# Patient Record
Sex: Female | Born: 1955
Health system: Southern US, Community
[De-identification: ages and names within clinical notes are randomized; demographics above are authoritative.]

## PROBLEM LIST (undated history)

## (undated) DIAGNOSIS — I10 Essential (primary) hypertension: Secondary | ICD-10-CM

## (undated) DIAGNOSIS — E559 Vitamin D deficiency, unspecified: Principal | ICD-10-CM

## (undated) DIAGNOSIS — J209 Acute bronchitis, unspecified: Secondary | ICD-10-CM

## (undated) DIAGNOSIS — M8580 Other specified disorders of bone density and structure, unspecified site: Secondary | ICD-10-CM

## (undated) DIAGNOSIS — K219 Gastro-esophageal reflux disease without esophagitis: Secondary | ICD-10-CM

## (undated) DIAGNOSIS — F419 Anxiety disorder, unspecified: Secondary | ICD-10-CM

## (undated) DIAGNOSIS — E785 Hyperlipidemia, unspecified: Principal | ICD-10-CM

## (undated) HISTORY — DX: Gastro-esophageal reflux disease without esophagitis: K21.9

## (undated) HISTORY — DX: Acute bronchitis, unspecified: J20.9

## (undated) HISTORY — DX: Anxiety disorder, unspecified: F41.9

## (undated) HISTORY — DX: Hyperlipidemia, unspecified: E78.5

## (undated) HISTORY — DX: Vitamin D deficiency, unspecified: E55.9

## (undated) HISTORY — DX: Essential (primary) hypertension: I10

## (undated) HISTORY — DX: Other specified disorders of bone density and structure, unspecified site: M85.80

---

## 1983-07-04 HISTORY — PX: ECTOPIC PREGNANCY SURGERY: SHX613

## 2014-07-03 LAB — HM PAP SMEAR: HM Pap smear: NORMAL

## 2014-07-03 LAB — HM MAMMOGRAPHY: HM Mammogram: NORMAL

## 2015-01-05 ENCOUNTER — Encounter: Payer: Self-pay | Admitting: *Deleted

## 2015-01-05 ENCOUNTER — Telehealth: Payer: Self-pay | Admitting: *Deleted

## 2015-01-05 NOTE — Telephone Encounter (Signed)
Pre-Visit Call completed with patient and chart updated.   Pre-Visit Info documented in Specialty Comments under [employer].    

## 2015-01-06 ENCOUNTER — Ambulatory Visit (INDEPENDENT_AMBULATORY_CARE_PROVIDER_SITE_OTHER): Payer: BLUE CROSS/BLUE SHIELD | Admitting: Family

## 2015-01-06 ENCOUNTER — Encounter: Payer: Self-pay | Admitting: Family

## 2015-01-06 VITALS — BP 130/90 | HR 85 | Temp 98.4°F | Resp 16 | Ht 62.25 in | Wt 143.6 lb

## 2015-01-06 DIAGNOSIS — Z23 Encounter for immunization: Secondary | ICD-10-CM | POA: Diagnosis not present

## 2015-01-06 DIAGNOSIS — M8580 Other specified disorders of bone density and structure, unspecified site: Secondary | ICD-10-CM

## 2015-01-06 DIAGNOSIS — Z0000 Encounter for general adult medical examination without abnormal findings: Secondary | ICD-10-CM | POA: Diagnosis not present

## 2015-01-06 DIAGNOSIS — I10 Essential (primary) hypertension: Secondary | ICD-10-CM | POA: Insufficient documentation

## 2015-01-06 LAB — BASIC METABOLIC PANEL
BUN: 15 mg/dL (ref 6–23)
CALCIUM: 10 mg/dL (ref 8.4–10.5)
CHLORIDE: 102 meq/L (ref 96–112)
CO2: 28 meq/L (ref 19–32)
Creatinine, Ser: 0.71 mg/dL (ref 0.40–1.20)
GFR: 89.4 mL/min (ref >60.00)
Glucose, Bld: 81 mg/dL (ref 70–99)
POTASSIUM: 3.3 meq/L — AB (ref 3.5–5.1)
SODIUM: 140 meq/L (ref 135–145)

## 2015-01-06 LAB — URINALYSIS, ROUTINE W REFLEX MICROSCOPIC
Bilirubin Urine: NEGATIVE
HGB URINE DIPSTICK: NEGATIVE
Ketones, ur: NEGATIVE
Leukocytes, UA: NEGATIVE
Nitrite: NEGATIVE
RBC / HPF: NONE SEEN (ref 0–?)
Specific Gravity, Urine: 1.01 (ref 1.000–1.030)
Total Protein, Urine: NEGATIVE
URINE GLUCOSE: NEGATIVE
Urobilinogen, UA: 0.2 (ref 0.0–1.0)
WBC, UA: NONE SEEN (ref 0–?)
pH: 7.5 (ref 5.0–8.0)

## 2015-01-06 LAB — HEPATIC FUNCTION PANEL
ALT: 15 U/L (ref 0–35)
AST: 14 U/L (ref 0–37)
Albumin: 4.1 g/dL (ref 3.5–5.2)
Alkaline Phosphatase: 50 U/L (ref 39–117)
BILIRUBIN DIRECT: 0 mg/dL (ref 0.0–0.3)
Total Bilirubin: 0.4 mg/dL (ref 0.2–1.2)
Total Protein: 7.3 g/dL (ref 6.0–8.3)

## 2015-01-06 LAB — LIPID PANEL
Cholesterol: 251 mg/dL — ABNORMAL HIGH (ref 0–200)
HDL: 88 mg/dL (ref >39.00)
LDL CALC: 139 mg/dL — AB (ref 0–99)
NonHDL: 163
TRIGLYCERIDES: 121 mg/dL (ref 0.0–149.0)
Total CHOL/HDL Ratio: 3
VLDL: 24.2 mg/dL (ref 0.0–40.0)

## 2015-01-06 LAB — TSH: TSH: 1.81 u[IU]/mL (ref 0.35–4.50)

## 2015-01-06 NOTE — Assessment & Plan Note (Signed)
Discussed healthy diet, exercise. Obtain routine lab work. Refer for mammogram, colonoscopy, dexa.

## 2015-01-06 NOTE — Patient Instructions (Addendum)
Please complete lab work prior to leaving. Welcome to [facility]!  Preventive Care for Adults A healthy lifestyle and preventive care can promote health and wellness. Preventive health guidelines for women include the following key practices.  A routine yearly physical is a good way to check with your health care provider about your health and preventive screening. It is a chance to share any concerns and updates on your health and to receive a thorough exam.  Visit your dentist for a routine exam and preventive care every 6 months. Brush your teeth twice a day and floss once a day. Good oral hygiene prevents tooth decay and gum disease.  The frequency of eye exams is based on your age, health, family medical history, use of contact lenses, and other factors. Follow your health care provider's recommendations for frequency of eye exams.  Eat a healthy diet. Foods like vegetables, fruits, whole grains, low-fat dairy products, and lean protein foods contain the nutrients you need without too many calories. Decrease your intake of foods high in solid fats, added sugars, and salt. Eat the right amount of calories for you.Get information about a proper diet from your health care provider, if necessary.  Regular physical exercise is one of the most important things you can do for your health. Most adults should get at least 150 minutes of moderate-intensity exercise (any activity that increases your heart rate and causes you to sweat) each week. In addition, most adults need muscle-strengthening exercises on 2 or more days a week.  Maintain a healthy weight. The body mass index (BMI) is a screening tool to identify possible weight problems. It provides an estimate of body fat based on height and weight. Your health care provider can find your BMI and can help you achieve or maintain a healthy weight.For adults 20 years and older:  A BMI below 18.5 is considered underweight.  A BMI of 18.5 to 24.9 is  normal.  A BMI of 25 to 29.9 is considered overweight.  A BMI of 30 and above is considered obese.  Maintain normal blood lipids and cholesterol levels by exercising and minimizing your intake of saturated fat. Eat a balanced diet with plenty of fruit and vegetables. Blood tests for lipids and cholesterol should begin at age 53 and be repeated every 5 years. If your lipid or cholesterol levels are high, you are over 50, or you are at high risk for heart disease, you may need your cholesterol levels checked more frequently.Ongoing high lipid and cholesterol levels should be treated with medicines if diet and exercise are not working.  If you smoke, find out from your health care provider how to quit. If you do not use tobacco, do not start.  Lung cancer screening is recommended for adults aged 26-80 years who are at high risk for developing lung cancer because of a history of smoking. A yearly low-dose CT scan of the lungs is recommended for people who have at least a 30-pack-year history of smoking and are a current smoker or have quit within the past 15 years. A pack year of smoking is smoking an average of 1 pack of cigarettes a day for 1 year (for example: 1 pack a day for 30 years or 2 packs a day for 15 years). Yearly screening should continue until the smoker has stopped smoking for at least 15 years. Yearly screening should be stopped for people who develop a health problem that would prevent them from having lung cancer treatment.  If  you are pregnant, do not drink alcohol. If you are breastfeeding, be very cautious about drinking alcohol. If you are not pregnant and choose to drink alcohol, do not have more than 1 drink per day. One drink is considered to be 12 ounces (355 mL) of beer, 5 ounces (148 mL) of wine, or 1.5 ounces (44 mL) of liquor.  Avoid use of street drugs. Do not share needles with anyone. Ask for help if you need support or instructions about stopping the use of  drugs.  High blood pressure causes heart disease and increases the risk of stroke. Your blood pressure should be checked at least every 1 to 2 years. Ongoing high blood pressure should be treated with medicines if weight loss and exercise do not work.  If you are 22-13 years old, ask your health care provider if you should take aspirin to prevent strokes.  Diabetes screening involves taking a blood sample to check your fasting blood sugar level. This should be done once every 3 years, after age 45, if you are within normal weight and without risk factors for diabetes. Testing should be considered at a younger age or be carried out more frequently if you are overweight and have at least 1 risk factor for diabetes.  Breast cancer screening is essential preventive care for women. You should practice "breast self-awareness." This means understanding the normal appearance and feel of your breasts and may include breast self-examination. Any changes detected, no matter how small, should be reported to a health care provider. Women in their 64s and 30s should have a clinical breast exam (CBE) by a health care provider as part of a regular health exam every 1 to 3 years. After age 32, women should have a CBE every year. Starting at age 58, women should consider having a mammogram (breast X-ray test) every year. Women who have a family history of breast cancer should talk to their health care provider about genetic screening. Women at a high risk of breast cancer should talk to their health care providers about having an MRI and a mammogram every year.  Breast cancer gene (BRCA)-related cancer risk assessment is recommended for women who have family members with BRCA-related cancers. BRCA-related cancers include breast, ovarian, tubal, and peritoneal cancers. Having family members with these cancers may be associated with an increased risk for harmful changes (mutations) in the breast cancer genes BRCA1 and BRCA2.  Results of the assessment will determine the need for genetic counseling and BRCA1 and BRCA2 testing.  Routine pelvic exams to screen for cancer are no longer recommended for nonpregnant women who are considered low risk for cancer of the pelvic organs (ovaries, uterus, and vagina) and who do not have symptoms. Ask your health care provider if a screening pelvic exam is right for you.  If you have had past treatment for cervical cancer or a condition that could lead to cancer, you need Pap tests and screening for cancer for at least 20 years after your treatment. If Pap tests have been discontinued, your risk factors (such as having a new sexual partner) need to be reassessed to determine if screening should be resumed. Some women have medical problems that increase the chance of getting cervical cancer. In these cases, your health care provider may recommend more frequent screening and Pap tests.  The HPV test is an additional test that may be used for cervical cancer screening. The HPV test looks for the virus that can cause the cell changes on  the cervix. The cells collected during the Pap test can be tested for HPV. The HPV test could be used to screen women aged 60 years and older, and should be used in women of any age who have unclear Pap test results. After the age of 51, women should have HPV testing at the same frequency as a Pap test.  Colorectal cancer can be detected and often prevented. Most routine colorectal cancer screening begins at the age of 4 years and continues through age 63 years. However, your health care provider may recommend screening at an earlier age if you have risk factors for colon cancer. On a yearly basis, your health care provider may provide home test kits to check for hidden blood in the stool. Use of a small camera at the end of a tube, to directly examine the colon (sigmoidoscopy or colonoscopy), can detect the earliest forms of colorectal cancer. Talk to your health  care provider about this at age 95, when routine screening begins. Direct exam of the colon should be repeated every 5-10 years through age 64 years, unless early forms of pre-cancerous polyps or small growths are found.  People who are at an increased risk for hepatitis B should be screened for this virus. You are considered at high risk for hepatitis B if:  You were born in a country where hepatitis B occurs often. Talk with your health care provider about which countries are considered high risk.  Your parents were born in a high-risk country and you have not received a shot to protect against hepatitis B (hepatitis B vaccine).  You have HIV or AIDS.  You use needles to inject street drugs.  You live with, or have sex with, someone who has hepatitis B.  You get hemodialysis treatment.  You take certain medicines for conditions like cancer, organ transplantation, and autoimmune conditions.  Hepatitis C blood testing is recommended for all people born from [birth year] through 1965 and any individual with known risks for hepatitis C.  Practice safe sex. Use condoms and avoid high-risk sexual practices to reduce the spread of sexually transmitted infections (STIs). STIs include gonorrhea, chlamydia, syphilis, trichomonas, herpes, HPV, and human immunodeficiency virus (HIV). Herpes, HIV, and HPV are viral illnesses that have no cure. They can result in disability, cancer, and death.  You should be screened for sexually transmitted illnesses (STIs) including gonorrhea and chlamydia if:  You are sexually active and are younger than 24 years.  You are older than 24 years and your health care provider tells you that you are at risk for this type of infection.  Your sexual activity has changed since you were last screened and you are at an increased risk for chlamydia or gonorrhea. Ask your health care provider if you are at risk.  If you are at risk of being infected with HIV, it is recommended  that you take a prescription medicine daily to prevent HIV infection. This is called preexposure prophylaxis (PrEP). You are considered at risk if:  You are a heterosexual woman, are sexually active, and are at increased risk for HIV infection.  You take drugs by injection.  You are sexually active with a partner who has HIV.  Talk with your health care provider about whether you are at high risk of being infected with HIV. If you choose to begin PrEP, you should first be tested for HIV. You should then be tested every 3 months for as long as you are taking PrEP.  Osteoporosis  is a disease in which the bones lose minerals and strength with aging. This can result in serious bone fractures or breaks. The risk of osteoporosis can be identified using a bone density scan. Women ages 33 years and over and women at risk for fractures or osteoporosis should discuss screening with their health care providers. Ask your health care provider whether you should take a calcium supplement or vitamin D to reduce the rate of osteoporosis.  Menopause can be associated with physical symptoms and risks. Hormone replacement therapy is available to decrease symptoms and risks. You should talk to your health care provider about whether hormone replacement therapy is right for you.  Use sunscreen. Apply sunscreen liberally and repeatedly throughout the day. You should seek shade when your shadow is shorter than you. Protect yourself by wearing long sleeves, pants, a wide-brimmed hat, and sunglasses year round, whenever you are outdoors.  Once a month, do a whole body skin exam, using a mirror to look at the skin on your back. Tell your health care provider of new moles, moles that have irregular borders, moles that are larger than a pencil eraser, or moles that have changed in shape or color.  Stay current with required vaccines (immunizations).  Influenza vaccine. All adults should be immunized every year.  Tetanus,  diphtheria, and acellular pertussis (Td, Tdap) vaccine. Pregnant women should receive 1 dose of Tdap vaccine during each pregnancy. The dose should be obtained regardless of the length of time since the last dose. Immunization is preferred during the 27th-36th week of gestation. An adult who has not previously received Tdap or who does not know her vaccine status should receive 1 dose of Tdap. This initial dose should be followed by tetanus and diphtheria toxoids (Td) booster doses every 10 years. Adults with an unknown or incomplete history of completing a 3-dose immunization series with Td-containing vaccines should begin or complete a primary immunization series including a Tdap dose. Adults should receive a Td booster every 10 years.  Varicella vaccine. An adult without evidence of immunity to varicella should receive 2 doses or a second dose if she has previously received 1 dose. Pregnant females who do not have evidence of immunity should receive the first dose after pregnancy. This first dose should be obtained before leaving the health care facility. The second dose should be obtained 4-8 weeks after the first dose.  Human papillomavirus (HPV) vaccine. Females aged 13-26 years who have not received the vaccine previously should obtain the 3-dose series. The vaccine is not recommended for use in pregnant females. However, pregnancy testing is not needed before receiving a dose. If a female is found to be pregnant after receiving a dose, no treatment is needed. In that case, the remaining doses should be delayed until after the pregnancy. Immunization is recommended for any person with an immunocompromised condition through the age of 77 years if she did not get any or all doses earlier. During the 3-dose series, the second dose should be obtained 4-8 weeks after the first dose. The third dose should be obtained 24 weeks after the first dose and 16 weeks after the second dose.  Zoster vaccine. One dose  is recommended for adults aged 26 years or older unless certain conditions are present.  Measles, mumps, and rubella (MMR) vaccine. Adults born before [birth year] generally are considered immune to measles and mumps. Adults born in [birth year] or later should have 1 or more doses of MMR vaccine unless there is a contraindication  to the vaccine or there is laboratory evidence of immunity to each of the three diseases. A routine second dose of MMR vaccine should be obtained at least 28 days after the first dose for students attending postsecondary schools, health care workers, or international travelers. People who received inactivated measles vaccine or an unknown type of measles vaccine during 1963-1967 should receive 2 doses of MMR vaccine. People who received inactivated mumps vaccine or an unknown type of mumps vaccine before 1979 and are at high risk for mumps infection should consider immunization with 2 doses of MMR vaccine. For females of childbearing age, rubella immunity should be determined. If there is no evidence of immunity, females who are not pregnant should be vaccinated. If there is no evidence of immunity, females who are pregnant should delay immunization until after pregnancy. Unvaccinated health care workers born before [birth year] who lack laboratory evidence of measles, mumps, or rubella immunity or laboratory confirmation of disease should consider measles and mumps immunization with 2 doses of MMR vaccine or rubella immunization with 1 dose of MMR vaccine.  Pneumococcal 13-valent conjugate (PCV13) vaccine. When indicated, a person who is uncertain of her immunization history and has no record of immunization should receive the PCV13 vaccine. An adult aged 73 years or older who has certain medical conditions and has not been previously immunized should receive 1 dose of PCV13 vaccine. This PCV13 should be followed with a dose of pneumococcal polysaccharide (PPSV23) vaccine. The PPSV23 vaccine dose should be  obtained at least 8 weeks after the dose of PCV13 vaccine. An adult aged 79 years or older who has certain medical conditions and previously received 1 or more doses of PPSV23 vaccine should receive 1 dose of PCV13. The PCV13 vaccine dose should be obtained 1 or more years after the last PPSV23 vaccine dose.  Pneumococcal polysaccharide (PPSV23) vaccine. When PCV13 is also indicated, PCV13 should be obtained first. All adults aged 50 years and older should be immunized. An adult younger than age 58 years who has certain medical conditions should be immunized. Any person who resides in a nursing home or long-term care facility should be immunized. An adult smoker should be immunized. People with an immunocompromised condition and certain other conditions should receive both PCV13 and PPSV23 vaccines. People with human immunodeficiency virus (HIV) infection should be immunized as soon as possible after diagnosis. Immunization during chemotherapy or radiation therapy should be avoided. Routine use of PPSV23 vaccine is not recommended for American Indians, [place] Natives, or people younger than 65 years unless there are medical conditions that require PPSV23 vaccine. When indicated, people who have unknown immunization and have no record of immunization should receive PPSV23 vaccine. One-time revaccination 5 years after the first dose of PPSV23 is recommended for people aged 19-64 years who have chronic kidney failure, nephrotic syndrome, asplenia, or immunocompromised conditions. People who received 1-2 doses of PPSV23 before age 73 years should receive another dose of PPSV23 vaccine at age 9 years or later if at least 5 years have passed since the previous dose. Doses of PPSV23 are not needed for people immunized with PPSV23 at or after age 48 years.  Meningococcal vaccine. Adults with asplenia or persistent complement component deficiencies should receive 2 doses of quadrivalent meningococcal conjugate  (MenACWY-D) vaccine. The doses should be obtained at least 2 months apart. Microbiologists working with certain meningococcal bacteria, [employer] recruits, people at risk during an outbreak, and people who travel to or live in countries with a high rate of  meningitis should be immunized. A first-year college student up through age 21 years who is living in a residence hall should receive a dose if she did not receive a dose on or after her 16th birthday. Adults who have certain high-risk conditions should receive one or more doses of vaccine.  Hepatitis A vaccine. Adults who wish to be protected from this disease, have certain high-risk conditions, work with hepatitis A-infected animals, work in hepatitis A research labs, or travel to or work in countries with a high rate of hepatitis A should be immunized. Adults who were previously unvaccinated and who anticipate close contact with an international adoptee during the first 60 days after arrival in the [place] from a country with a high rate of hepatitis A should be immunized.  Hepatitis B vaccine. Adults who wish to be protected from this disease, have certain high-risk conditions, may be exposed to blood or other infectious body fluids, are household contacts or sex partners of hepatitis B positive people, are clients or workers in certain care facilities, or travel to or work in countries with a high rate of hepatitis B should be immunized.  Haemophilus influenzae type b (Hib) vaccine. A previously unvaccinated person with asplenia or sickle cell disease or having a scheduled splenectomy should receive 1 dose of Hib vaccine. Regardless of previous immunization, a recipient of a hematopoietic stem cell transplant should receive a 3-dose series 6-12 months after her successful transplant. Hib vaccine is not recommended for adults with HIV infection. Preventive Services / Frequency Ages 21 to 57 years  Blood pressure check.** / Every 1 to 2  years.  Lipid and cholesterol check.** / Every 5 years beginning at age 15.  Clinical breast exam.** / Every 3 years for women in their 59s and 7s.  BRCA-related cancer risk assessment.** / For women who have family members with a BRCA-related cancer (breast, ovarian, tubal, or peritoneal cancers).  Pap test.** / Every 2 years from ages 35 through 47. Every 3 years starting at age 58 through age 51 or 67 with a history of 3 consecutive normal Pap tests.  HPV screening.** / Every 3 years from ages 58 through ages 1 to 59 with a history of 3 consecutive normal Pap tests.  Hepatitis C blood test.** / For any individual with known risks for hepatitis C.  Skin self-exam. / Monthly.  Influenza vaccine. / Every year.  Tetanus, diphtheria, and acellular pertussis (Tdap, Td) vaccine.** / Consult your health care provider. Pregnant women should receive 1 dose of Tdap vaccine during each pregnancy. 1 dose of Td every 10 years.  Varicella vaccine.** / Consult your health care provider. Pregnant females who do not have evidence of immunity should receive the first dose after pregnancy.  HPV vaccine. / 3 doses over 6 months, if 58 and younger. The vaccine is not recommended for use in pregnant females. However, pregnancy testing is not needed before receiving a dose.  Measles, mumps, rubella (MMR) vaccine.** / You need at least 1 dose of MMR if you were born in 1957 or later. You may also need a 2nd dose. For females of childbearing age, rubella immunity should be determined. If there is no evidence of immunity, females who are not pregnant should be vaccinated. If there is no evidence of immunity, females who are pregnant should delay immunization until after pregnancy.  Pneumococcal 13-valent conjugate (PCV13) vaccine.** / Consult your health care provider.  Pneumococcal polysaccharide (PPSV23) vaccine.** / 1 to 2 doses if you  smoke cigarettes or if you have certain conditions.  Meningococcal  vaccine.** / 1 dose if you are age 23 to 11 years and a Market researcher living in a residence hall, or have one of several medical conditions, you need to get vaccinated against meningococcal disease. You may also need additional booster doses.  Hepatitis A vaccine.** / Consult your health care provider.  Hepatitis B vaccine.** / Consult your health care provider.  Haemophilus influenzae type b (Hib) vaccine.** / Consult your health care provider. Ages 4 to 75 years  Blood pressure check.** / Every 1 to 2 years.  Lipid and cholesterol check.** / Every 5 years beginning at age 57 years.  Lung cancer screening. / Every year if you are aged 64-80 years and have a 30-pack-year history of smoking and currently smoke or have quit within the past 15 years. Yearly screening is stopped once you have quit smoking for at least 15 years or develop a health problem that would prevent you from having lung cancer treatment.  Clinical breast exam.** / Every year after age 71 years.  BRCA-related cancer risk assessment.** / For women who have family members with a BRCA-related cancer (breast, ovarian, tubal, or peritoneal cancers).  Mammogram.** / Every year beginning at age 62 years and continuing for as long as you are in good health. Consult with your health care provider.  Pap test.** / Every 3 years starting at age 18 years through age 91 or 88 years with a history of 3 consecutive normal Pap tests.  HPV screening.** / Every 3 years from ages 62 years through ages 10 to 69 years with a history of 3 consecutive normal Pap tests.  Fecal occult blood test (FOBT) of stool. / Every year beginning at age 41 years and continuing until age 41 years. You may not need to do this test if you get a colonoscopy every 10 years.  Flexible sigmoidoscopy or colonoscopy.** / Every 5 years for a flexible sigmoidoscopy or every 10 years for a colonoscopy beginning at age 21 years and continuing until age 102  years.  Hepatitis C blood test.** / For all people born from [birth year] through 1965 and any individual with known risks for hepatitis C.  Skin self-exam. / Monthly.  Influenza vaccine. / Every year.  Tetanus, diphtheria, and acellular pertussis (Tdap/Td) vaccine.** / Consult your health care provider. Pregnant women should receive 1 dose of Tdap vaccine during each pregnancy. 1 dose of Td every 10 years.  Varicella vaccine.** / Consult your health care provider. Pregnant females who do not have evidence of immunity should receive the first dose after pregnancy.  Zoster vaccine.** / 1 dose for adults aged 6 years or older.  Measles, mumps, rubella (MMR) vaccine.** / You need at least 1 dose of MMR if you were born in 1957 or later. You may also need a 2nd dose. For females of childbearing age, rubella immunity should be determined. If there is no evidence of immunity, females who are not pregnant should be vaccinated. If there is no evidence of immunity, females who are pregnant should delay immunization until after pregnancy.  Pneumococcal 13-valent conjugate (PCV13) vaccine.** / Consult your health care provider.  Pneumococcal polysaccharide (PPSV23) vaccine.** / 1 to 2 doses if you smoke cigarettes or if you have certain conditions.  Meningococcal vaccine.** / Consult your health care provider.  Hepatitis A vaccine.** / Consult your health care provider.  Hepatitis B vaccine.** / Consult your health care provider.  Haemophilus influenzae  type b (Hib) vaccine.** / Consult your health care provider. Ages 105 years and over  Blood pressure check.** / Every 1 to 2 years.  Lipid and cholesterol check.** / Every 5 years beginning at age 84 years.  Lung cancer screening. / Every year if you are aged 75-80 years and have a 30-pack-year history of smoking and currently smoke or have quit within the past 15 years. Yearly screening is stopped once you have quit smoking for at least 15 years or  develop a health problem that would prevent you from having lung cancer treatment.  Clinical breast exam.** / Every year after age 45 years.  BRCA-related cancer risk assessment.** / For women who have family members with a BRCA-related cancer (breast, ovarian, tubal, or peritoneal cancers).  Mammogram.** / Every year beginning at age 76 years and continuing for as long as you are in good health. Consult with your health care provider.  Pap test.** / Every 3 years starting at age 34 years through age 56 or 69 years with 3 consecutive normal Pap tests. Testing can be stopped between 65 and 70 years with 3 consecutive normal Pap tests and no abnormal Pap or HPV tests in the past 10 years.  HPV screening.** / Every 3 years from ages 25 years through ages 30 or 6 years with a history of 3 consecutive normal Pap tests. Testing can be stopped between 65 and 70 years with 3 consecutive normal Pap tests and no abnormal Pap or HPV tests in the past 10 years.  Fecal occult blood test (FOBT) of stool. / Every year beginning at age 67 years and continuing until age 41 years. You may not need to do this test if you get a colonoscopy every 10 years.  Flexible sigmoidoscopy or colonoscopy.** / Every 5 years for a flexible sigmoidoscopy or every 10 years for a colonoscopy beginning at age 8 years and continuing until age 44 years.  Hepatitis C blood test.** / For all people born from [birth year] through 1965 and any individual with known risks for hepatitis C.  Osteoporosis screening.** / A one-time screening for women ages 41 years and over and women at risk for fractures or osteoporosis.  Skin self-exam. / Monthly.  Influenza vaccine. / Every year.  Tetanus, diphtheria, and acellular pertussis (Tdap/Td) vaccine.** / 1 dose of Td every 10 years.  Varicella vaccine.** / Consult your health care provider.  Zoster vaccine.** / 1 dose for adults aged 16 years or older.  Pneumococcal 13-valent conjugate  (PCV13) vaccine.** / Consult your health care provider.  Pneumococcal polysaccharide (PPSV23) vaccine.** / 1 dose for all adults aged 29 years and older.  Meningococcal vaccine.** / Consult your health care provider.  Hepatitis A vaccine.** / Consult your health care provider.  Hepatitis B vaccine.** / Consult your health care provider.  Haemophilus influenzae type b (Hib) vaccine.** / Consult your health care provider. ** Family history and personal history of risk and conditions may change your health care provider's recommendations. Document Released: 08/15/2001 Document Revised: 11/03/2013 Document Reviewed: 11/14/2010 [facility] Patient Information 2015 [place], [place]. This information is not intended to replace advice given to you by your health care provider. Make sure you discuss any questions you have with your health care provider.

## 2015-01-06 NOTE — Progress Notes (Signed)
Subjective:    [name], female    DOB: 02-Feb-1956, 59 y.o.   MRN: [id number]  HPI  Jeanette Petty is a 59 yr old female who presents today to establish care. Reports that she has used urgent cares but has not had a physical in "years."   Immunizations: due for tetanus Diet: reports that her diet is healthy Exercise: walks some,  Colonoscopy: due Dexa: 5 yrs ago Pap Smear: 2016 normal per pt Mammogram: up to date Vision:  Up to date Dental: up to date  HTN- Reports that she was diagnosed 3-4 yrs ago and has been treated by Dr. Sharlet Salina on [facility].  She is maintained on dyazide.  She reports + compliance with diazide.  BP Readings from Last 3 Encounters:  01/06/15 130/90     Review of Systems  Constitutional: Negative for unexpected weight change.  HENT: Negative for hearing loss and rhinorrhea.   Eyes: Negative for visual disturbance.  Respiratory: Negative for cough.   Cardiovascular: Negative for leg swelling.  Gastrointestinal: Positive for constipation. Negative for nausea and diarrhea.  Genitourinary: Negative for dysuria and frequency.  Musculoskeletal: Negative for myalgias and arthralgias.  Skin: Positive for rash.       Recent poison ivy  Neurological: Negative for headaches.  Hematological: Negative for adenopathy.  Psychiatric/Behavioral: Negative for dysphoric mood and agitation.   Past Medical History  Diagnosis Date  . Hypertension     History   Social History  . Marital Status: Married    Spouse Name: N/A  . Number of Children: N/A  . Years of Education: N/A   Occupational History  . Not on file.   Social History Main Topics  . Smoking status: Former Research scientist (life sciences)  . Smokeless tobacco: Never Used  . Alcohol Use: 0.6 - 1.2 oz/week    1-2 Cans of beer per week  . Drug Use: No  . Sexual Activity: Not on file   Other Topics Concern  . Not on file   Social History Narrative   Married   Works At [employer]   2 children   [place] (daughter)- has 2 children (lives in [place])   [place] (1991)- lives will   Grew up in [place]       Past Surgical History  Procedure Laterality Date  . Ectopic pregnancy surgery  1985    Family History  Problem Relation Age of Onset  . Hypertension Father   . Cancer Father     prostate  . Hyperlipidemia Father   . Cancer Maternal Grandfather 60    colon    No Known Allergies  Current Outpatient Prescriptions on File Prior to Visit  Medication Sig Dispense Refill  . norethindrone-ethinyl estradiol (JUNEL FE,GILDESS FE,LOESTRIN FE) 1-20 MG-MCG tablet Take 1 tablet by mouth daily.    Marland Kitchen triamterene-hydrochlorothiazide (DYAZIDE) 37.5-25 MG per capsule Take 1 capsule by mouth daily.     No current facility-administered medications on file prior to visit.    BP 130/90 mmHg  Pulse 85  Temp(Src) 98.4 F (36.9 C) (Oral)  Resp 16  Ht 5' 2.25" (1.581 m)  Wt 143 lb 9.6 oz (65.137 kg)  BMI 26.06 kg/m2  SpO2 99%  LMP 01/05/2005        Objective:   Physical Exam   Physical Exam  Constitutional: She is oriented to person, place, and time. She appears well-developed and well-nourished. No distress.  HENT:  Head: Normocephalic and atraumatic.  Right Ear:  Tympanic membrane and ear canal normal.  Left Ear: Tympanic membrane and ear canal normal.  Mouth/Throat: Oropharynx is clear and moist.  Eyes: Pupils are equal, round, and reactive to light. No scleral icterus.  Neck: Normal range of motion. No thyromegaly present.  Cardiovascular: Normal rate and regular rhythm.   No murmur heard. Pulmonary/Chest: Effort normal and breath sounds normal. No respiratory distress. He has no wheezes. She has no rales. She exhibits no tenderness.  Abdominal: Soft. Bowel sounds are normal. He exhibits no distension and no mass. There is no tenderness. There is no rebound and no guarding.  Musculoskeletal: She exhibits no edema.  Lymphadenopathy:    She has no cervical  adenopathy.  Neurological: She is alert and oriented to person, place, and time. She has normal patellar reflexes. She exhibits normal muscle tone. Coordination normal.  Skin: Skin is warm and dry.  Psychiatric: She has a normal mood and affect. Her behavior is normal. Judgment and thought content normal. Breast/pelvic: deferred   Assessment & Plan:        Assessment & Plan:  EKG tracing is personally reviewed.  EKG notes NSR.  No acute changes.

## 2015-01-06 NOTE — Progress Notes (Signed)
Pre visit review using our clinic review tool, if applicable. No additional management support is needed unless otherwise documented below in the visit note. 

## 2015-01-06 NOTE — Assessment & Plan Note (Signed)
Fair BP control, continue dyazide. Discussed low sodium diet.

## 2015-01-07 LAB — CBC WITH DIFFERENTIAL/PLATELET
Basophils Absolute: 0.1 10*3/uL (ref 0.0–0.1)
Basophils Relative: 1 % (ref 0.0–3.0)
EOS ABS: 0.1 10*3/uL (ref 0.0–0.7)
Eosinophils Relative: 1 % (ref 0.0–5.0)
HCT: 45.2 % (ref 36.0–46.0)
Hemoglobin: 15.1 g/dL — ABNORMAL HIGH (ref 12.0–15.0)
Lymphocytes Relative: 21.4 % (ref 12.0–46.0)
Lymphs Abs: 1.8 10*3/uL (ref 0.7–4.0)
MCHC: 33.4 g/dL (ref 30.0–36.0)
MCV: 92.5 fl (ref 78.0–100.0)
Monocytes Absolute: 0.2 10*3/uL (ref 0.1–1.0)
Monocytes Relative: 2.3 % — ABNORMAL LOW (ref 3.0–12.0)
NEUTROS ABS: 6.1 10*3/uL (ref 1.4–7.7)
Neutrophils Relative %: 74.3 % (ref 43.0–77.0)
PLATELETS: 263 10*3/uL (ref 150.0–400.0)
RBC: 4.88 Mil/uL (ref 3.87–5.11)
RDW: 14 % (ref 11.5–15.5)
WBC: 8.2 10*3/uL (ref 4.0–10.5)

## 2015-01-11 ENCOUNTER — Encounter: Payer: Self-pay | Admitting: Family

## 2015-01-11 ENCOUNTER — Telehealth: Payer: Self-pay | Admitting: Family

## 2015-01-11 DIAGNOSIS — E785 Hyperlipidemia, unspecified: Secondary | ICD-10-CM | POA: Insufficient documentation

## 2015-01-11 DIAGNOSIS — E876 Hypokalemia: Secondary | ICD-10-CM

## 2015-01-11 HISTORY — DX: Hyperlipidemia, unspecified: E78.5

## 2015-01-11 MED ORDER — POTASSIUM CHLORIDE CRYS ER 10 MEQ PO TBCR: 10.0000 meq | EXTENDED_RELEASE_TABLET | Freq: Every day | ORAL | Status: DC

## 2015-01-11 NOTE — Telephone Encounter (Signed)
Please advise pt k+ is low.  Add Kdur once daily, repeat bmet in 1 week.  Cholesterol is high. Advise low fat/low cholesterol diet and exercise. Repeat lipids in 6 months, dx hyperlipidemia.

## 2015-01-12 NOTE — Telephone Encounter (Signed)
Notified pt and she voices understanding.  Repeat potassium scheduled for 01/29/15 at 8:15am, FLP scheduled for 07/15/15 at 7am.  Future lab orders entered.

## 2015-01-18 ENCOUNTER — Ambulatory Visit (HOSPITAL_BASED_OUTPATIENT_CLINIC_OR_DEPARTMENT_OTHER)
Admission: RE | Admit: 2015-01-18 | Discharge: 2015-01-18 | Disposition: A | Discharge status: Home / Self Care | Payer: BLUE CROSS/BLUE SHIELD | Source: Ambulatory Visit | Attending: Family | Admitting: Family

## 2015-01-18 DIAGNOSIS — Z0000 Encounter for general adult medical examination without abnormal findings: Secondary | ICD-10-CM | POA: Diagnosis not present

## 2015-01-18 DIAGNOSIS — M8580 Other specified disorders of bone density and structure, unspecified site: Secondary | ICD-10-CM | POA: Insufficient documentation

## 2015-01-19 ENCOUNTER — Other Ambulatory Visit (INDEPENDENT_AMBULATORY_CARE_PROVIDER_SITE_OTHER): Payer: BLUE CROSS/BLUE SHIELD

## 2015-01-19 DIAGNOSIS — E876 Hypokalemia: Secondary | ICD-10-CM

## 2015-01-19 DIAGNOSIS — E785 Hyperlipidemia, unspecified: Secondary | ICD-10-CM

## 2015-01-19 LAB — LIPID PANEL
CHOL/HDL RATIO: 3
Cholesterol: 220 mg/dL — ABNORMAL HIGH (ref 0–200)
HDL: 81.2 mg/dL (ref >39.00)
LDL Cholesterol: 119 mg/dL — ABNORMAL HIGH (ref 0–99)
NonHDL: 138.8
Triglycerides: 98 mg/dL (ref 0.0–149.0)
VLDL: 19.6 mg/dL (ref 0.0–40.0)

## 2015-01-19 LAB — BASIC METABOLIC PANEL
BUN: 16 mg/dL (ref 6–23)
CO2: 26 mEq/L (ref 19–32)
CREATININE: 0.72 mg/dL (ref 0.40–1.20)
Calcium: 9.3 mg/dL (ref 8.4–10.5)
Chloride: 104 mEq/L (ref 96–112)
GFR: 87.96 mL/min (ref >60.00)
Glucose, Bld: 92 mg/dL (ref 70–99)
Potassium: 3.4 mEq/L — ABNORMAL LOW (ref 3.5–5.1)
SODIUM: 139 meq/L (ref 135–145)

## 2015-01-21 ENCOUNTER — Other Ambulatory Visit: Payer: Self-pay | Admitting: Family

## 2015-01-21 ENCOUNTER — Encounter: Payer: Self-pay | Admitting: Family

## 2015-01-21 ENCOUNTER — Telehealth: Payer: Self-pay | Admitting: Family

## 2015-01-21 DIAGNOSIS — E876 Hypokalemia: Secondary | ICD-10-CM

## 2015-01-21 DIAGNOSIS — M8580 Other specified disorders of bone density and structure, unspecified site: Secondary | ICD-10-CM | POA: Insufficient documentation

## 2015-01-21 MED ORDER — CALCIUM CARBONATE-VITAMIN D 600-400 MG-UNIT PO TABS: 1.0000 | ORAL_TABLET | Freq: Two times a day (BID) | ORAL | Status: DC

## 2015-01-21 NOTE — Telephone Encounter (Signed)
Potassium still low.  Increase Kdur to 20MeQ (2 tabs) twice daily. Repeat bmet in 1 week.

## 2015-01-22 NOTE — Telephone Encounter (Signed)
She has 1meq tabs and should take 2 tabs bid please.

## 2015-01-22 NOTE — Telephone Encounter (Signed)
Melissa-- please verify potassium directions below. I have already notified pt but saw on the med list that potassium is 47meQ once a day. Below says 53meQ twice a day.  Please advise.   Left detailed message on cell# and to check mychart account as I will also send message to pt.  See lab result below:  bone density shows osteopenia or "bone thinning." I recommend that you start calcium in the form of of caltrate 600mg  + D one tablet by mouth twice daily. This is available over the counter. In addition, please ensure regular weight bearing exercise such as walking. I would like pt to return for vit D level too please, dx osteopenia.

## 2015-01-22 NOTE — Telephone Encounter (Signed)
-----   Message from [name], NP sent at 01/21/2015 11:11 AM EDT ----- bone density shows osteopenia or "bone thinning."  I recommend that you start calcium in the form of of caltrate 600mg  + D one tablet by mouth twice daily. This is available over the counter.  In addition, please ensure regular weight bearing exercise such as walking. I would like pt to return for vit D level too please, dx osteopenia.

## 2015-01-22 NOTE — Telephone Encounter (Signed)
Med list has been updated 

## 2015-01-28 ENCOUNTER — Other Ambulatory Visit (INDEPENDENT_AMBULATORY_CARE_PROVIDER_SITE_OTHER): Payer: BLUE CROSS/BLUE SHIELD

## 2015-01-28 DIAGNOSIS — M8580 Other specified disorders of bone density and structure, unspecified site: Secondary | ICD-10-CM | POA: Diagnosis not present

## 2015-01-28 DIAGNOSIS — E876 Hypokalemia: Secondary | ICD-10-CM | POA: Diagnosis not present

## 2015-01-28 LAB — BASIC METABOLIC PANEL
BUN: 12 mg/dL (ref 6–23)
CHLORIDE: 100 meq/L (ref 96–112)
CO2: 28 mEq/L (ref 19–32)
Calcium: 10.5 mg/dL (ref 8.4–10.5)
Creatinine, Ser: 0.75 mg/dL (ref 0.40–1.20)
GFR: 83.9 mL/min (ref >60.00)
Glucose, Bld: 87 mg/dL (ref 70–99)
Potassium: 3.4 mEq/L — ABNORMAL LOW (ref 3.5–5.1)
Sodium: 138 mEq/L (ref 135–145)

## 2015-01-28 LAB — VITAMIN D 25 HYDROXY (VIT D DEFICIENCY, FRACTURES): VITD: 16.16 ng/mL — AB (ref 30.00–100.00)

## 2015-01-29 ENCOUNTER — Other Ambulatory Visit: Payer: Self-pay | Admitting: Family

## 2015-01-29 ENCOUNTER — Encounter: Payer: Self-pay | Admitting: Family

## 2015-01-29 DIAGNOSIS — E876 Hypokalemia: Secondary | ICD-10-CM

## 2015-01-29 DIAGNOSIS — E559 Vitamin D deficiency, unspecified: Secondary | ICD-10-CM

## 2015-01-29 HISTORY — DX: Vitamin D deficiency, unspecified: E55.9

## 2015-01-29 MED ORDER — VITAMIN D (ERGOCALCIFEROL) 1.25 MG (50000 UNIT) PO CAPS: 50000.0000 [IU] | ORAL_CAPSULE | ORAL | Status: DC

## 2015-01-29 MED ORDER — POTASSIUM CHLORIDE CRYS ER 20 MEQ PO TBCR: 20.0000 meq | EXTENDED_RELEASE_TABLET | Freq: Two times a day (BID) | ORAL | Status: DC

## 2015-01-29 NOTE — Telephone Encounter (Signed)
Notified pt. Rxs sent. Future lab orders entered and lab appts scheduled for 02/04/15 and 04/23/15.

## 2015-01-29 NOTE — Telephone Encounter (Signed)
Notified pt and she is agreeable to proceed with Vitamin D rx. Pt states she was taking Potassium 54mEq twice a day. How should she proceed with new dose?

## 2015-01-29 NOTE — Telephone Encounter (Signed)
Vitamin D level is low.  Advise patient to begin vit D 50000 units once weekly for 12 weeks, then repeat vit D level (dx Vit D deficiency).    Also, potassium is still low.  Please confirm that she is taking kdur 71mEQ bid.  If so, increase to 15mEQ in and 20 in PM, repeat bmet in 1 week, dx hypokalemia. She is currently taking the 53meq tabs. I will send rx for 20 meq tabs.

## 2015-01-29 NOTE — Telephone Encounter (Signed)
Increase to 41meq twice daily. Thanks.

## 2015-02-04 ENCOUNTER — Other Ambulatory Visit (INDEPENDENT_AMBULATORY_CARE_PROVIDER_SITE_OTHER): Payer: BLUE CROSS/BLUE SHIELD

## 2015-02-04 DIAGNOSIS — E876 Hypokalemia: Secondary | ICD-10-CM | POA: Diagnosis not present

## 2015-02-04 DIAGNOSIS — E559 Vitamin D deficiency, unspecified: Secondary | ICD-10-CM

## 2015-02-04 LAB — BASIC METABOLIC PANEL
BUN: 16 mg/dL (ref 6–23)
CHLORIDE: 102 meq/L (ref 96–112)
CO2: 30 mEq/L (ref 19–32)
CREATININE: 0.78 mg/dL (ref 0.40–1.20)
Calcium: 10.2 mg/dL (ref 8.4–10.5)
GFR: 80.19 mL/min (ref >60.00)
GLUCOSE: 89 mg/dL (ref 70–99)
Potassium: 3.7 mEq/L (ref 3.5–5.1)
Sodium: 140 mEq/L (ref 135–145)

## 2015-02-04 LAB — VITAMIN D 25 HYDROXY (VIT D DEFICIENCY, FRACTURES): VITD: 21.89 ng/mL — ABNORMAL LOW (ref 30.00–100.00)

## 2015-02-05 ENCOUNTER — Encounter: Payer: Self-pay | Admitting: Family

## 2015-03-20 ENCOUNTER — Other Ambulatory Visit: Payer: Self-pay | Admitting: Family

## 2015-04-08 ENCOUNTER — Ambulatory Visit (INDEPENDENT_AMBULATORY_CARE_PROVIDER_SITE_OTHER): Payer: BLUE CROSS/BLUE SHIELD | Admitting: Family Medicine

## 2015-04-08 ENCOUNTER — Encounter: Payer: Self-pay | Admitting: Family Medicine

## 2015-04-08 VITALS — BP 148/72 | HR 92 | Temp 98.2°F | Ht 62.0 in | Wt 143.5 lb

## 2015-04-08 DIAGNOSIS — I10 Essential (primary) hypertension: Secondary | ICD-10-CM | POA: Diagnosis not present

## 2015-04-08 DIAGNOSIS — J209 Acute bronchitis, unspecified: Secondary | ICD-10-CM

## 2015-04-08 DIAGNOSIS — L237 Allergic contact dermatitis due to plants, except food: Secondary | ICD-10-CM

## 2015-04-08 MED ORDER — BENZONATATE 100 MG PO CAPS: 100.0000 mg | ORAL_CAPSULE | Freq: Two times a day (BID) | ORAL | Status: DC | PRN

## 2015-04-08 MED ORDER — ALBUTEROL SULFATE HFA 108 (90 BASE) MCG/ACT IN AERS: 2.0000 | INHALATION_SPRAY | Freq: Four times a day (QID) | RESPIRATORY_TRACT | Status: DC | PRN

## 2015-04-08 MED ORDER — METHYLPREDNISOLONE 4 MG PO TABS: ORAL_TABLET | ORAL | Status: DC

## 2015-04-08 MED ORDER — AZITHROMYCIN 250 MG PO TABS: ORAL_TABLET | ORAL | Status: DC

## 2015-04-08 NOTE — Progress Notes (Signed)
[name], female   DOB: [birth year], 59 y.o.   MRN: [id number]   Subjective:    [name], female    DOB: [birth year], 59 y.o.   MRN: [id number]  Chief Complaint  Patient presents with  . Poison Ivy  . Cough    HPI Patient is in today for evaluation of cough and congestion as well as dermatitis. Patient has pruritic rashes with vesicles on both arms. Also struggling with cough and congestion. Also struggling with fatigue and headache. Has been using over-the-counter cough and cold preparations including Tylenol Cold and cough. Has noted mild improvement in symptoms temporarily when she uses these. Acknowledges some reflux as well. Denies CP/palp/SOB/HA/congestion/fevers or GU c/o. Taking meds as prescribed  Past Medical History  Diagnosis Date  . Hypertension   . Hyperlipidemia 01/11/2015  . Vitamin D deficiency 01/29/2015    Past Surgical History  Procedure Laterality Date  . Ectopic pregnancy surgery  1985    Family History  Problem Relation Age of Onset  . Hypertension Father   . Cancer Father     prostate  . Hyperlipidemia Father   . Cancer Maternal Grandfather 62    colon    Social History   Social History  . Marital Status: Married    Spouse Name: N/A  . Number of Children: N/A  . Years of Education: N/A   Occupational History  . Not on file.   Social History Main Topics  . Smoking status: Former Research scientist (life sciences)  . Smokeless tobacco: Never Used  . Alcohol Use: 0.6 - 1.2 oz/week    1-2 Cans of beer per week  . Drug Use: No  . Sexual Activity: Not on file   Other Topics Concern  . Not on file   Social History Narrative   Married   Works At [employer]   2 children   [place] (daughter)- has 2 children (lives in [place])   [place] (1991)- lives will   Grew up in [place] [facility]       Outpatient Prescriptions Prior to Visit  Medication Sig Dispense Refill  . Calcium Carbonate-Vitamin D (CALTRATE 600+D) 600-400 MG-UNIT  per tablet Take 1 tablet by mouth 2 (two) times daily.    . norethindrone-ethinyl estradiol (JUNEL FE,GILDESS FE,LOESTRIN FE) 1-20 MG-MCG tablet Take 1 tablet by mouth daily.    . potassium chloride SA (K-DUR,KLOR-CON) 20 MEQ tablet TAKE 1 TABLET(20 MEQ) BY MOUTH TWICE DAILY 60 tablet 5  . triamterene-hydrochlorothiazide (DYAZIDE) 37.5-25 MG per capsule Take 1 capsule by mouth daily.    . Vitamin D, Ergocalciferol, (DRISDOL) 50000 UNITS CAPS capsule Take 1 capsule (50,000 Units total) by mouth every 7 (seven) days. 12 capsule 0   No facility-administered medications prior to visit.    No Known Allergies  Review of Systems  Constitutional: Positive for malaise/fatigue. Negative for fever and chills.  HENT: Positive for congestion.   Eyes: Negative for discharge.  Respiratory: Positive for cough and sputum production. Negative for shortness of breath.   Cardiovascular: Negative for chest pain, palpitations and leg swelling.  Gastrointestinal: Negative for nausea and abdominal pain.  Genitourinary: Negative for dysuria.  Musculoskeletal: Negative for falls.  Skin: Positive for itching and rash.  Neurological: Negative for loss of consciousness and headaches.  Endo/Heme/Allergies: Negative for environmental allergies.  Psychiatric/Behavioral: Negative for depression. The patient is not nervous/anxious.        Objective:    Physical Exam  Constitutional: She is oriented to person,  place, and time. She appears well-developed and well-nourished. No distress.  HENT:  Head: Normocephalic and atraumatic.  Nose: Nose normal.  Eyes: Right eye exhibits no discharge. Left eye exhibits no discharge.  Neck: Normal range of motion. Neck supple.  Cardiovascular: Normal rate and regular rhythm.   No murmur heard. Pulmonary/Chest: Effort normal and breath sounds normal.  Abdominal: Soft. Bowel sounds are normal. There is no tenderness.  Musculoskeletal: She exhibits no edema.  Neurological: She  is alert and oriented to person, place, and time.  Skin: Skin is warm and dry. Rash noted. There is erythema.  Small erythematous lesions, some vesicles. On arms  Psychiatric: She has a normal mood and affect.  Nursing note and vitals reviewed.   BP 158/86 mmHg  Pulse 92  Temp(Src) 98.2 F (36.8 C) (Oral)  Ht 5\' 2"  (1.575 m)  Wt 143 lb 8 oz (65.091 kg)  BMI 26.24 kg/m2  SpO2 100%  LMP 01/05/2005 Wt Readings from Last 3 Encounters:  04/08/15 143 lb 8 oz (65.091 kg)  01/06/15 143 lb 9.6 oz (65.137 kg)     Lab Results  Component Value Date   WBC 8.2 01/06/2015   HGB 15.1* 01/06/2015   HCT 45.2 01/06/2015   PLT 263.0 01/06/2015   GLUCOSE 89 02/04/2015   CHOL 220* 01/19/2015   TRIG 98.0 01/19/2015   HDL 81.20 01/19/2015   LDLCALC 119* 01/19/2015   ALT 15 01/06/2015   AST 14 01/06/2015   NA 140 02/04/2015   K 3.7 02/04/2015   CL 102 02/04/2015   CREATININE 0.78 02/04/2015   BUN 16 02/04/2015   CO2 30 02/04/2015   TSH 1.81 01/06/2015    Lab Results  Component Value Date   TSH 1.81 01/06/2015   Lab Results  Component Value Date   WBC 8.2 01/06/2015   HGB 15.1* 01/06/2015   HCT 45.2 01/06/2015   MCV 92.5 01/06/2015   PLT 263.0 01/06/2015   Lab Results  Component Value Date   NA 140 02/04/2015   K 3.7 02/04/2015   CO2 30 02/04/2015   GLUCOSE 89 02/04/2015   BUN 16 02/04/2015   CREATININE 0.78 02/04/2015   BILITOT 0.4 01/06/2015   ALKPHOS 50 01/06/2015   AST 14 01/06/2015   ALT 15 01/06/2015   PROT 7.3 01/06/2015   ALBUMIN 4.1 01/06/2015   CALCIUM 10.2 02/04/2015   GFR 80.19 02/04/2015   Lab Results  Component Value Date   CHOL 220* 01/19/2015   Lab Results  Component Value Date   HDL 81.20 01/19/2015   Lab Results  Component Value Date   LDLCALC 119* 01/19/2015   Lab Results  Component Value Date   TRIG 98.0 01/19/2015   Lab Results  Component Value Date   CHOLHDL 3 01/19/2015   No results found for: HGBA1C     Assessment & Plan:     HTN (hypertension) Mildly elevated with OTC cough and cold preparations. encouraged to only use [place] HBP for cough OTC  Poison ivy dermatitis Encouraged to clean with Dawn dishwashing liquid and [employer]. Given 5 day course of steroids.   Acute bronchitis Started on antibiotics and steroids. Encouraged increased rest and hydration, add probiotics, zinc such as Coldeze or Xicam. Treat fevers as needed   I am having Ms. Hamre maintain her triamterene-hydrochlorothiazide, norethindrone-ethinyl estradiol, Calcium Carbonate-Vitamin D, Vitamin D (Ergocalciferol), and potassium chloride SA.  No orders of the defined types were placed in this encounter.     Rulon Abide  Nathaneil Canary, LPN

## 2015-04-08 NOTE — Progress Notes (Signed)
Pre visit review using our clinic review tool, if applicable. No additional management support is needed unless otherwise documented below in the visit note. 

## 2015-04-08 NOTE — Patient Instructions (Addendum)
Use Coricidan HBP OTC cough med that is safe for people with blood pressure Witch hazel astringent and dawn dishwashing liquid for poison Mucinex twice daily x 10 days  64 oz of clear liquids Vita c 500 to 1010m g daily Zinc such as Coldeeze Elderberry liquid Zyrtec or Claritin daily for next week or so til rash is gone  [employer] is a inflammation of the skin (contact dermatitis) caused by touching the allergens on the leaves of the ivy plant following previous exposure to the plant. The rash usually appears 48 hours after exposure. The rash is usually bumps (papules) or blisters (vesicles) in a linear pattern. Depending on your own sensitivity, the rash may simply cause redness and itching, or it may also progress to blisters which may break open. These must be well cared for to prevent secondary bacterial (germ) infection, followed by scarring. Keep any open areas dry, clean, dressed, and covered with an antibacterial ointment if needed. The eyes may also get puffy. The puffiness is worst in the morning and gets better as the day progresses. This dermatitis usually heals without scarring, within 2 to 3 weeks without treatment. HOME CARE INSTRUCTIONS  Thoroughly wash with soap and water as soon as you have been exposed to poison ivy. You have about one half hour to remove the plant resin before it will cause the rash. This washing will destroy the oil or antigen on the skin that is causing, or will cause, the rash. Be sure to wash under your fingernails as any plant resin there will continue to spread the rash. Do not rub skin vigorously when washing affected area. Poison ivy cannot spread if no oil from the plant remains on your body. A rash that has progressed to weeping sores will not spread the rash unless you have not washed thoroughly. It is also important to wash any clothes you have been wearing as these may carry active allergens. The rash will return if you wear the unwashed  clothing, even several days later. Avoidance of the plant in the future is the best measure. Poison ivy plant can be recognized by the number of leaves. Generally, poison ivy has three leaves with flowering branches on a single stem. Diphenhydramine may be purchased over the counter and used as needed for itching. Do not drive with this medication if it makes you drowsy.Ask your caregiver about medication for children. SEEK MEDICAL CARE IF:  Open sores develop.  Redness spreads beyond area of rash.  You notice purulent (pus-like) discharge.  You have increased pain.  Other signs of infection develop (such as fever).   This information is not intended to replace advice given to you by your health care provider. Make sure you discuss any questions you have with your health care provider.   Document Released: 06/16/2000 Document Revised: 09/11/2011 Document Reviewed: 11/25/2014 Elsevier Interactive Patient Education 2016 Elsevier Inc. Acute Bronchitis Bronchitis is inflammation of the airways that extend from the windpipe into the lungs (bronchi). The inflammation often causes mucus to develop. This leads to a cough, which is the most common symptom of bronchitis.  In acute bronchitis, the condition usually develops suddenly and goes away over time, usually in a couple weeks. Smoking, allergies, and asthma can make bronchitis worse. Repeated episodes of bronchitis may cause further lung problems.  CAUSES Acute bronchitis is most often caused by the same virus that causes a cold. The virus can spread from person to person (contagious) through coughing, sneezing, and  touching contaminated objects. SIGNS AND SYMPTOMS   Cough.   Fever.   Coughing up mucus.   Body aches.   Chest congestion.   Chills.   Shortness of breath.   Sore throat.  DIAGNOSIS  Acute bronchitis is usually diagnosed through a physical exam. Your health care provider will also ask you questions about  your medical history. Tests, such as chest X-rays, are sometimes done to rule out other conditions.  TREATMENT  Acute bronchitis usually goes away in a couple weeks. Oftentimes, no medical treatment is necessary. Medicines are sometimes given for relief of fever or cough. Antibiotic medicines are usually not needed but may be prescribed in certain situations. In some cases, an inhaler may be recommended to help reduce shortness of breath and control the cough. A cool mist vaporizer may also be used to help thin bronchial secretions and make it easier to clear the chest.  HOME CARE INSTRUCTIONS  Get plenty of rest.   Drink enough fluids to keep your urine clear or pale yellow (unless you have a medical condition that requires fluid restriction). Increasing fluids may help thin your respiratory secretions (sputum) and reduce chest congestion, and it will prevent dehydration.   Take medicines only as directed by your health care provider.  If you were prescribed an antibiotic medicine, finish it all even if you start to feel better.  Avoid smoking and secondhand smoke. Exposure to cigarette smoke or irritating chemicals will make bronchitis worse. If you are a smoker, consider using nicotine gum or skin patches to help control withdrawal symptoms. Quitting smoking will help your lungs heal faster.   Reduce the chances of another bout of acute bronchitis by washing your hands frequently, avoiding people with cold symptoms, and trying not to touch your hands to your mouth, nose, or eyes.   Keep all follow-up visits as directed by your health care provider.  SEEK MEDICAL CARE IF: Your symptoms do not improve after 1 week of treatment.  SEEK IMMEDIATE MEDICAL CARE IF:  You develop an increased fever or chills.   You have chest pain.   You have severe shortness of breath.  You have bloody sputum.   You develop dehydration.  You faint or repeatedly feel like you are going to pass  out.  You develop repeated vomiting.  You develop a severe headache. MAKE SURE YOU:   Understand these instructions.  Will watch your condition.  Will get help right away if you are not doing well or get worse.   This information is not intended to replace advice given to you by your health care provider. Make sure you discuss any questions you have with your health care provider.   Document Released: 07/27/2004 Document Revised: 07/10/2014 Document Reviewed: 12/10/2012 Elsevier Interactive Patient Education [employer].

## 2015-04-09 ENCOUNTER — Ambulatory Visit: Payer: BLUE CROSS/BLUE SHIELD | Admitting: Family

## 2015-04-11 ENCOUNTER — Encounter: Payer: Self-pay | Admitting: Family Medicine

## 2015-04-11 DIAGNOSIS — J209 Acute bronchitis, unspecified: Secondary | ICD-10-CM

## 2015-04-11 DIAGNOSIS — L237 Allergic contact dermatitis due to plants, except food: Secondary | ICD-10-CM | POA: Insufficient documentation

## 2015-04-11 HISTORY — DX: Acute bronchitis, unspecified: J20.9

## 2015-04-11 NOTE — Assessment & Plan Note (Signed)
Mildly elevated with OTC cough and cold preparations. encouraged to only use Coricidan HBP for cough OTC

## 2015-04-11 NOTE — Assessment & Plan Note (Signed)
Started on antibiotics and steroids. Encouraged increased rest and hydration, add probiotics, zinc such as Coldeze or Xicam. Treat fevers as needed

## 2015-04-11 NOTE — Assessment & Plan Note (Signed)
Encouraged to clean with Dawn dishwashing liquid and [employer]. Given 5 day course of steroids.

## 2015-04-23 ENCOUNTER — Other Ambulatory Visit (INDEPENDENT_AMBULATORY_CARE_PROVIDER_SITE_OTHER): Payer: BLUE CROSS/BLUE SHIELD

## 2015-04-23 ENCOUNTER — Other Ambulatory Visit: Payer: Self-pay | Admitting: Family

## 2015-04-23 ENCOUNTER — Encounter: Payer: Self-pay | Admitting: Family

## 2015-04-23 DIAGNOSIS — E559 Vitamin D deficiency, unspecified: Secondary | ICD-10-CM | POA: Diagnosis not present

## 2015-04-23 LAB — VITAMIN D 25 HYDROXY (VIT D DEFICIENCY, FRACTURES): VITD: 42.58 ng/mL (ref 30.00–100.00)

## 2015-04-23 MED ORDER — VITAMIN D3 75 MCG (3000 UT) PO TABS: 1.0000 | ORAL_TABLET | Freq: Every day | ORAL | Status: AC

## 2015-05-18 ENCOUNTER — Ambulatory Visit (INDEPENDENT_AMBULATORY_CARE_PROVIDER_SITE_OTHER): Payer: BLUE CROSS/BLUE SHIELD | Admitting: Family

## 2015-05-18 VITALS — BP 134/88 | HR 80

## 2015-05-18 DIAGNOSIS — I10 Essential (primary) hypertension: Secondary | ICD-10-CM | POA: Diagnosis not present

## 2015-05-18 NOTE — Progress Notes (Signed)
BP is reviewed.

## 2015-05-18 NOTE — Patient Instructions (Signed)
Continue medications as ordered. Return for follow up in 3 months

## 2015-05-18 NOTE — Progress Notes (Signed)
Pre visit review using our clinic review tool, if applicable. No additional management support is needed unless otherwise documented below in the visit note.  Patient in for Blood Pressure Check. 

## 2015-07-15 ENCOUNTER — Other Ambulatory Visit (INDEPENDENT_AMBULATORY_CARE_PROVIDER_SITE_OTHER): Payer: BLUE CROSS/BLUE SHIELD

## 2015-07-15 DIAGNOSIS — E785 Hyperlipidemia, unspecified: Secondary | ICD-10-CM

## 2015-07-15 LAB — LIPID PANEL
CHOL/HDL RATIO: 3
Cholesterol: 210 mg/dL — ABNORMAL HIGH (ref 0–200)
HDL: 76.9 mg/dL (ref >39.00)
LDL CALC: 115 mg/dL — AB (ref 0–99)
NONHDL: 133.11
Triglycerides: 90 mg/dL (ref 0.0–149.0)
VLDL: 18 mg/dL (ref 0.0–40.0)

## 2015-07-17 ENCOUNTER — Encounter: Payer: Self-pay | Admitting: Family

## 2015-08-18 ENCOUNTER — Ambulatory Visit: Payer: BLUE CROSS/BLUE SHIELD | Admitting: Family

## 2015-08-24 ENCOUNTER — Ambulatory Visit (INDEPENDENT_AMBULATORY_CARE_PROVIDER_SITE_OTHER): Payer: BLUE CROSS/BLUE SHIELD | Admitting: Family

## 2015-08-24 ENCOUNTER — Encounter: Payer: Self-pay | Admitting: Family

## 2015-08-24 VITALS — BP 138/77 | HR 92 | Temp 98.5°F | Resp 16 | Ht 62.25 in | Wt 147.4 lb

## 2015-08-24 DIAGNOSIS — Z0000 Encounter for general adult medical examination without abnormal findings: Secondary | ICD-10-CM

## 2015-08-24 DIAGNOSIS — I10 Essential (primary) hypertension: Secondary | ICD-10-CM

## 2015-08-24 LAB — BASIC METABOLIC PANEL
BUN: 13 mg/dL (ref 6–23)
CALCIUM: 9.9 mg/dL (ref 8.4–10.5)
CO2: 29 mEq/L (ref 19–32)
CREATININE: 0.79 mg/dL (ref 0.40–1.20)
Chloride: 103 mEq/L (ref 96–112)
GFR: 78.87 mL/min (ref >60.00)
Glucose, Bld: 90 mg/dL (ref 70–99)
Potassium: 3.9 mEq/L (ref 3.5–5.1)
Sodium: 140 mEq/L (ref 135–145)

## 2015-08-24 NOTE — Addendum Note (Signed)
Addended by: [name] on: 08/24/2015 08:20 AM   Modules accepted: Orders, SmartSet

## 2015-08-24 NOTE — Assessment & Plan Note (Signed)
BP stable. Continue triamterene-hctz. Obtain follow up bmet.

## 2015-08-24 NOTE — Progress Notes (Signed)
   Subjective:    [name], female    DOB: December 12, 1955, 60 y.o.   MRN: [id number]  HPI  Jeanette Petty is a 60 yr old female who presents today for follow up. Reports good compliance with medication.   BP Readings from Last 3 Encounters:  08/24/15 138/77  05/18/15 134/88  04/08/15 148/72     Review of Systems  Respiratory: Negative for shortness of breath.   Cardiovascular: Negative for chest pain and leg swelling.   Past Medical History  Diagnosis Date  . Hypertension   . Hyperlipidemia 01/11/2015  . Vitamin D deficiency 01/29/2015  . Poison ivy dermatitis 04/11/2015  . Acute bronchitis 04/11/2015    Social History   Social History  . Marital Status: Married    Spouse Name: N/A  . Number of Children: N/A  . Years of Education: N/A   Occupational History  . Not on file.   Social History Main Topics  . Smoking status: Former Research scientist (life sciences)  . Smokeless tobacco: Never Used  . Alcohol Use: 0.6 - 1.2 oz/week    1-2 Cans of beer per week  . Drug Use: No  . Sexual Activity: Not on file   Other Topics Concern  . Not on file   Social History Narrative   Married   Works At [employer]   2 children   [place] (daughter)- has 2 children (lives in [place])   [place] (1991)- lives will   Grew up in [place]       Past Surgical History  Procedure Laterality Date  . Ectopic pregnancy surgery  1985    Family History  Problem Relation Age of Onset  . Hypertension Father   . Cancer Father     prostate  . Hyperlipidemia Father   . Cancer Maternal Grandfather 53    colon    No Known Allergies  Current Outpatient Prescriptions on File Prior to Visit  Medication Sig Dispense Refill  . Calcium Carbonate-Vitamin D (CALTRATE 600+D) 600-400 MG-UNIT per tablet Take 1 tablet by mouth 2 (two) times daily.    . Cholecalciferol (VITAMIN D3) 3000 UNITS TABS Take 1 tablet by mouth daily. 30 tablet   . potassium chloride SA (K-DUR,KLOR-CON) 20 MEQ tablet  TAKE 1 TABLET(20 MEQ) BY MOUTH TWICE DAILY 60 tablet 5  . triamterene-hydrochlorothiazide (DYAZIDE) 37.5-25 MG per capsule Take 1 capsule by mouth daily.     No current facility-administered medications on file prior to visit.    BP 138/77 mmHg  Pulse 92  Temp(Src) 98.5 F (36.9 C) (Oral)  Resp 16  Ht 5' 2.25" (1.581 m)  Wt 147 lb 6.4 oz (66.86 kg)  BMI 26.75 kg/m2  SpO2 99%  LMP 01/05/2005       Objective:   Physical Exam  Constitutional: She is oriented to person, place, and time. She appears well-developed and well-nourished.  Cardiovascular: Normal rate, regular rhythm and normal heart sounds.   No murmur heard. Pulmonary/Chest: Effort normal and breath sounds normal. No respiratory distress. She has no wheezes.  Musculoskeletal: She exhibits no edema.  Neurological: She is alert and oriented to person, place, and time.  Psychiatric: She has a normal mood and affect. Her behavior is normal. Judgment and thought content normal.          Assessment & Plan:

## 2015-08-24 NOTE — Patient Instructions (Signed)
Please complete lab work prior to leaving.   

## 2015-08-24 NOTE — Progress Notes (Signed)
Pre visit review using our clinic review tool, if applicable. No additional management support is needed unless otherwise documented below in the visit note. 

## 2016-04-08 ENCOUNTER — Other Ambulatory Visit: Payer: Self-pay | Admitting: Family

## 2016-07-13 ENCOUNTER — Other Ambulatory Visit: Payer: Self-pay | Admitting: Family

## 2016-10-02 ENCOUNTER — Other Ambulatory Visit: Payer: Self-pay | Admitting: Family

## 2016-10-27 ENCOUNTER — Telehealth: Payer: Self-pay | Admitting: Family

## 2016-10-27 NOTE — Telephone Encounter (Signed)
Potassium request denied. Last refill of 10/03/16 said pt needs office visit for further refills. Pt last seen in this office 08/2015. Please call pt to schedule appointment if she is still seeing Melissa.  Thanks!

## 2016-10-30 LAB — HM MAMMOGRAPHY: HM Mammogram: NORMAL (ref 0–4)

## 2016-11-01 NOTE — Telephone Encounter (Signed)
Letter mailed to pt to schedule fasting physical soon if she is continuing to see Melissa for Primary care. Further refills cannot be given until she is seen in the office.

## 2016-11-01 NOTE — Telephone Encounter (Signed)
lvm for pt to call back to schedule appt.

## 2016-11-10 ENCOUNTER — Ambulatory Visit (INDEPENDENT_AMBULATORY_CARE_PROVIDER_SITE_OTHER): Payer: BLUE CROSS/BLUE SHIELD | Admitting: Family

## 2016-11-10 ENCOUNTER — Encounter: Payer: Self-pay | Admitting: Family

## 2016-11-10 VITALS — BP 130/87 | HR 83 | Temp 98.3°F | Resp 16 | Ht 62.0 in | Wt 146.8 lb

## 2016-11-10 DIAGNOSIS — Z0000 Encounter for general adult medical examination without abnormal findings: Secondary | ICD-10-CM

## 2016-11-10 DIAGNOSIS — L304 Erythema intertrigo: Secondary | ICD-10-CM | POA: Diagnosis not present

## 2016-11-10 DIAGNOSIS — E559 Vitamin D deficiency, unspecified: Secondary | ICD-10-CM | POA: Diagnosis not present

## 2016-11-10 DIAGNOSIS — I10 Essential (primary) hypertension: Secondary | ICD-10-CM | POA: Diagnosis not present

## 2016-11-10 LAB — BASIC METABOLIC PANEL
BUN: 14 mg/dL (ref 6–23)
CALCIUM: 10.4 mg/dL (ref 8.4–10.5)
CO2: 28 meq/L (ref 19–32)
Chloride: 102 mEq/L (ref 96–112)
Creatinine, Ser: 0.75 mg/dL (ref 0.40–1.20)
GFR: 83.4 mL/min (ref >60.00)
GLUCOSE: 96 mg/dL (ref 70–99)
Potassium: 3.8 mEq/L (ref 3.5–5.1)
Sodium: 139 mEq/L (ref 135–145)

## 2016-11-10 MED ORDER — NYSTATIN 100000 UNIT/GM EX POWD
Freq: Four times a day (QID) | CUTANEOUS | 1 refills | Status: DC
Start: 2016-11-10 — End: 2016-11-29

## 2016-11-10 NOTE — Assessment & Plan Note (Signed)
Obtain follow up vit d.

## 2016-11-10 NOTE — Progress Notes (Signed)
Subjective:    [name], female    DOB: [birth year], 61 y.o.   MRN: [id number]  HPI  Jeanette Petty is a 61 yr old female who presents today for follow up of her hypertension.  She is maintained on dyazide and kdur.  Denies CP/SOB or swelling. Reports good compliance with a low sodium diet.   BP Readings from Last 3 Encounters:  11/10/16 130/87  08/24/15 138/77  05/18/15 134/88   Vitamin D deficiency- Continues an over the counter supplement.   Reports skin rash beneath her breasts and on leg folds Review of Systems    see HPI  Past Medical History:  Diagnosis Date  . Acute bronchitis 04/11/2015  . Hyperlipidemia 01/11/2015  . Hypertension   . Poison ivy dermatitis 04/11/2015  . Vitamin D deficiency 01/29/2015     Social History   Social History  . Marital status: Married    Spouse name: N/A  . Number of children: N/A  . Years of education: N/A   Occupational History  . Not on file.   Social History Main Topics  . Smoking status: Former Research scientist (life sciences)  . Smokeless tobacco: Never Used  . Alcohol use 0.6 - 1.2 oz/week    1 - 2 Cans of beer per week  . Drug use: No  . Sexual activity: Not on file   Other Topics Concern  . Not on file   Social History Narrative   Married   Works At [employer]   2 children   [place] (daughter)- has 2 children (lives in [place])   [place] (1991)- lives will   Grew up in [place]       Past Surgical History:  Procedure Laterality Date  . ECTOPIC PREGNANCY SURGERY  1985    Family History  Problem Relation Age of Onset  . Hypertension Father   . Cancer Father        prostate  . Hyperlipidemia Father   . Cancer Maternal Grandfather 43       colon    No Known Allergies  Current Outpatient Prescriptions on File Prior to Visit  Medication Sig Dispense Refill  . Calcium Carbonate-Vitamin D (CALTRATE 600+D) 600-400 MG-UNIT per tablet Take 1 tablet by mouth 2 (two) times daily.    . Cholecalciferol  (VITAMIN D3) 3000 UNITS TABS Take 1 tablet by mouth daily. 30 tablet   . JEVANTIQUE LO 0.5-2.5 MG-MCG tablet Take 1 tablet by mouth daily.  3  . potassium chloride SA (K-DUR,KLOR-CON) 20 MEQ tablet TAKE 1 TABLET(20 MEQ) BY MOUTH TWICE DAILY 60 tablet 5  . triamterene-hydrochlorothiazide (DYAZIDE) 37.5-25 MG per capsule Take 1 capsule by mouth daily.     No current facility-administered medications on file prior to visit.     BP 130/87 (BP Location: Right Arm, Cuff Size: Normal)   Pulse 83   Temp 98.3 F (36.8 C) (Oral)   Resp 16   Ht 5\' 2"  (1.575 m)   Wt 146 lb 12.8 oz (66.6 kg)   LMP 01/05/2005   SpO2 100%   BMI 26.85 kg/m    Objective:   Physical Exam  Constitutional: She appears well-developed and well-nourished.  Cardiovascular: Normal rate, regular rhythm and normal heart sounds.   No murmur heard. Pulmonary/Chest: Effort normal and breath sounds normal. No respiratory distress. She has no wheezes.  Skin:  Mild erythematous rash noted beneath breasts.   Psychiatric: She has a normal mood and affect. Her behavior is normal.  Judgment and thought content normal.          Assessment & Plan:  Intertrigo- rx with nystatin powder.

## 2016-11-10 NOTE — Patient Instructions (Signed)
You will be contacted about your referral for colonoscopy. Apply nystatin powder to rash 1-2 times daily.  Keep affected areas clean and dry. Complete lab work prior to leaving. Schedule a complete physical at the front desk.

## 2016-11-10 NOTE — Progress Notes (Signed)
Pre visit review using our clinic review tool, if applicable. No additional management support is needed unless otherwise documented below in the visit note. 

## 2016-11-10 NOTE — Assessment & Plan Note (Signed)
Stable on current meds. Continue same, obtain bmet.

## 2016-11-13 ENCOUNTER — Encounter: Payer: Self-pay | Admitting: Family

## 2016-11-29 ENCOUNTER — Encounter: Payer: Self-pay | Admitting: Family

## 2016-11-29 ENCOUNTER — Ambulatory Visit (INDEPENDENT_AMBULATORY_CARE_PROVIDER_SITE_OTHER): Payer: BLUE CROSS/BLUE SHIELD | Admitting: Family

## 2016-11-29 VITALS — BP 122/78 | HR 82 | Temp 98.4°F | Resp 16 | Ht 61.5 in | Wt 147.4 lb

## 2016-11-29 DIAGNOSIS — Z0000 Encounter for general adult medical examination without abnormal findings: Secondary | ICD-10-CM | POA: Diagnosis not present

## 2016-11-29 DIAGNOSIS — E2839 Other primary ovarian failure: Secondary | ICD-10-CM

## 2016-11-29 LAB — CBC WITH DIFFERENTIAL/PLATELET
BASOS ABS: 0.1 10*3/uL (ref 0.0–0.1)
Basophils Relative: 1.2 % (ref 0.0–3.0)
Eosinophils Absolute: 0.1 10*3/uL (ref 0.0–0.7)
Eosinophils Relative: 0.9 % (ref 0.0–5.0)
HCT: 44 % (ref 36.0–46.0)
HEMOGLOBIN: 14.8 g/dL (ref 12.0–15.0)
Lymphocytes Relative: 24.8 % (ref 12.0–46.0)
Lymphs Abs: 1.6 10*3/uL (ref 0.7–4.0)
MCHC: 33.8 g/dL (ref 30.0–36.0)
MCV: 92.2 fl (ref 78.0–100.0)
MONO ABS: 0.5 10*3/uL (ref 0.1–1.0)
MONOS PCT: 7 % (ref 3.0–12.0)
NEUTROS PCT: 66.1 % (ref 43.0–77.0)
Neutro Abs: 4.3 10*3/uL (ref 1.4–7.7)
Platelets: 298 10*3/uL (ref 150.0–400.0)
RBC: 4.77 Mil/uL (ref 3.87–5.11)
RDW: 13.5 % (ref 11.5–15.5)
WBC: 6.5 10*3/uL (ref 4.0–10.5)

## 2016-11-29 LAB — HEPATIC FUNCTION PANEL
ALK PHOS: 43 U/L (ref 39–117)
ALT: 17 U/L (ref 0–35)
AST: 16 U/L (ref 0–37)
Albumin: 4.6 g/dL (ref 3.5–5.2)
BILIRUBIN TOTAL: 0.4 mg/dL (ref 0.2–1.2)
Bilirubin, Direct: 0.1 mg/dL (ref 0.0–0.3)
Total Protein: 7.5 g/dL (ref 6.0–8.3)

## 2016-11-29 LAB — LIPID PANEL
CHOL/HDL RATIO: 3
Cholesterol: 214 mg/dL — ABNORMAL HIGH (ref 0–200)
HDL: 77.3 mg/dL (ref >39.00)
LDL CALC: 116 mg/dL — AB (ref 0–99)
NONHDL: 137.11
Triglycerides: 107 mg/dL (ref 0.0–149.0)
VLDL: 21.4 mg/dL (ref 0.0–40.0)

## 2016-11-29 LAB — TSH: TSH: 1.72 u[IU]/mL (ref 0.35–4.50)

## 2016-11-29 MED ORDER — POTASSIUM CHLORIDE CRYS ER 20 MEQ PO TBCR: EXTENDED_RELEASE_TABLET | ORAL | 5 refills | Status: DC

## 2016-11-29 MED ORDER — NYSTATIN 100000 UNIT/GM EX CREA: 1.0000 "application " | TOPICAL_CREAM | Freq: Two times a day (BID) | CUTANEOUS | 5 refills | Status: DC

## 2016-11-29 NOTE — Patient Instructions (Signed)
Continue healthy diet, exercise. Please schedule colonoscopy. Contact your insurance to see if Shingrix is covered (shingles vaccine) then contact us to schedule a nurse visit.

## 2016-11-29 NOTE — Progress Notes (Signed)
Subjective:    [name], female    DOB: August 21, 1955, 60 y.o.   MRN: [id number]  HPI  Patient presents today for complete physical.  Immunizations: tdap 01/06/15 Diet: reports diet is good Exercise: walks several times a week.  Colonoscopy: none- will schedule.   Dexa:01/18/15 Pap Smear: 07/03/14 Mammogram:10/30/16     Review of Systems  Constitutional: Negative for unexpected weight change.  HENT: Negative for hearing loss and rhinorrhea.   Eyes: Negative for visual disturbance.  Respiratory: Negative for cough.   Cardiovascular: Negative for leg swelling.  Gastrointestinal: Negative for constipation and diarrhea.  Genitourinary: Negative for dysuria and frequency.  Musculoskeletal: Negative for myalgias.       Notes some mild right hip pain, worse after sitting.    Skin: Negative for rash.  Neurological: Negative for headaches.  Hematological: Negative for adenopathy.  Psychiatric/Behavioral:       Denies depression/anxiety       Past Medical History:  Diagnosis Date  . Acute bronchitis 04/11/2015  . Hyperlipidemia 01/11/2015  . Hypertension   . Poison ivy dermatitis 04/11/2015  . Vitamin D deficiency 01/29/2015     Social History   Social History  . Marital status: Married    Spouse name: N/A  . Number of children: N/A  . Years of education: N/A   Occupational History  . Not on file.   Social History Main Topics  . Smoking status: Former Research scientist (life sciences)  . Smokeless tobacco: Never Used  . Alcohol use 0.6 - 1.2 oz/week    1 - 2 Cans of beer per week  . Drug use: No  . Sexual activity: Not on file   Other Topics Concern  . Not on file   Social History Narrative   Married   Works At [employer]   2 children   [place] (daughter)- has 2 children (lives in [place])   [place] (1991)- lives will   Grew up in [place]       Past Surgical History:  Procedure Laterality Date  . ECTOPIC PREGNANCY SURGERY  1985    Family History    Problem Relation Age of Onset  . Hypertension Father   . Cancer Father        prostate  . Hyperlipidemia Father   . Cancer Maternal Grandfather 67       colon    No Known Allergies  Current Outpatient Prescriptions on File Prior to Visit  Medication Sig Dispense Refill  . Calcium Carbonate-Vitamin D (CALTRATE 600+D) 600-400 MG-UNIT per tablet Take 1 tablet by mouth 2 (two) times daily.    . Cholecalciferol (VITAMIN D3) 3000 UNITS TABS Take 1 tablet by mouth daily. 30 tablet   . JEVANTIQUE LO 0.5-2.5 MG-MCG tablet Take 1 tablet by mouth daily.  3  . nystatin (MYCOSTATIN/NYSTOP) powder Apply topically 4 (four) times daily. 30 g 1  . potassium chloride SA (K-DUR,KLOR-CON) 20 MEQ tablet TAKE 1 TABLET(20 MEQ) BY MOUTH TWICE DAILY 60 tablet 5  . triamterene-hydrochlorothiazide (DYAZIDE) 37.5-25 MG per capsule Take 1 capsule by mouth daily.     No current facility-administered medications on file prior to visit.     BP 122/78 (BP Location: Right Arm, Cuff Size: Normal)   Pulse 82   Temp 98.4 F (36.9 C) (Oral)   Resp 16   Ht 5' 1.5" (1.562 m)   Wt 147 lb 6.4 oz (66.9 kg)   LMP 01/05/2005   SpO2 99%   BMI  27.40 kg/m    Objective:   Physical Exam  Skin: Skin is warm and dry.  Mild rash noted beneath breasts.    Physical Exam  Constitutional: She is oriented to person, place, and time. She appears well-developed and well-nourished. No distress.  HENT:  Head: Normocephalic and atraumatic.  Right Ear: Tympanic membrane and ear canal normal.  Left Ear: Tympanic membrane and ear canal normal.  Mouth/Throat: Oropharynx is clear and moist.  Eyes: Pupils are equal, round, and reactive to light. No scleral icterus.  Neck: Normal range of motion. No thyromegaly present.  Cardiovascular: Normal rate and regular rhythm.   No murmur heard. Pulmonary/Chest: Effort normal and breath sounds normal. No respiratory distress. He has no wheezes. She has no rales. She exhibits no tenderness.   Abdominal: Soft. Bowel sounds are normal. She exhibits no distension and no mass. There is no tenderness. There is no rebound and no guarding.  Musculoskeletal: She exhibits no edema.  Lymphadenopathy:    She has no cervical adenopathy.  Neurological: She is alert and oriented to person, place, and time. She has normal patellar reflexes. She exhibits normal muscle tone. Coordination normal.  Skin: Skin is warm and dry.  Psychiatric: She has a normal mood and affect. Her behavior is normal. Judgment and thought content normal.  Breasts: Examined lying Right: Without masses, retractions, discharge or axillary adenopathy.  Left: Without masses, retractions, discharge or axillary adenopathy.  Pelvic: deferred         Assessment & Plan:          Assessment & Plan:  Preventative Care- encouraged patient to continue healthy diet and regular exercise. EKG tracing is personally reviewed.  EKG notes NSR.  No acute changes. We discussed importance of following through with colonoscopy. Tetanus up to date. We also discussed shingrix and I encouraged her to check with her insurance to verify that this is covered then schedule a nurse visit. Will obtain routine lab work.

## 2016-11-30 LAB — URINALYSIS, ROUTINE W REFLEX MICROSCOPIC
Bilirubin Urine: NEGATIVE
Hgb urine dipstick: NEGATIVE
Ketones, ur: NEGATIVE
Leukocytes, UA: NEGATIVE
Nitrite: NEGATIVE
PH: 7 (ref 5.0–8.0)
RBC / HPF: NONE SEEN (ref 0–?)
SPECIFIC GRAVITY, URINE: 1.01 (ref 1.000–1.030)
TOTAL PROTEIN, URINE-UPE24: NEGATIVE
URINE GLUCOSE: NEGATIVE
Urobilinogen, UA: 0.2 (ref 0.0–1.0)
WBC, UA: NONE SEEN (ref 0–?)

## 2017-12-03 ENCOUNTER — Other Ambulatory Visit: Payer: Self-pay | Admitting: Family

## 2017-12-03 NOTE — Telephone Encounter (Signed)
Copied from [place] [phone]. Topic: Quick Communication - Rx Refill/Question >> Dec 03, 2017 12:29 PM Oliver Pila B wrote: Medication: potassium chloride SA (K-DUR,KLOR-CON) 20 MEQ tablet [[id number]]   Has the patient contacted their pharmacy? Yes.   (Agent: If no, request that the patient contact the pharmacy for the refill.) (Agent: If yes, when and what did the pharmacy advise?)  Preferred Pharmacy (with phone number or street name): [facility]  Agent: Please be advised that RX refills may take up to 3 business days. We ask that you follow-up with your pharmacy.

## 2017-12-04 MED ORDER — POTASSIUM CHLORIDE CRYS ER 20 MEQ PO TBCR: EXTENDED_RELEASE_TABLET | ORAL | 0 refills | Status: DC

## 2017-12-04 NOTE — Telephone Encounter (Signed)
Potassium chloride SA refill Last Refill:11/29/16 #60 with 5 refills Last OV: 11/29/16 Next OV: 12/20/17 PCP: [name] Pharmacy:[facility]

## 2017-12-20 ENCOUNTER — Encounter: Payer: Self-pay | Admitting: Family

## 2017-12-20 ENCOUNTER — Ambulatory Visit (INDEPENDENT_AMBULATORY_CARE_PROVIDER_SITE_OTHER): Payer: No Typology Code available for payment source | Admitting: Family

## 2017-12-20 VITALS — BP 138/74 | HR 83 | Temp 99.0°F | Resp 16 | Ht 62.0 in | Wt 138.8 lb

## 2017-12-20 DIAGNOSIS — M8580 Other specified disorders of bone density and structure, unspecified site: Secondary | ICD-10-CM

## 2017-12-20 DIAGNOSIS — Z0000 Encounter for general adult medical examination without abnormal findings: Secondary | ICD-10-CM

## 2017-12-20 MED ORDER — NYSTATIN 100000 UNIT/GM EX POWD: Freq: Two times a day (BID) | CUTANEOUS | 5 refills | Status: DC

## 2017-12-20 MED ORDER — NYSTATIN 100000 UNIT/GM EX CREA: 1.0000 "application " | TOPICAL_CREAM | Freq: Two times a day (BID) | CUTANEOUS | 5 refills | Status: DC

## 2017-12-20 NOTE — Patient Instructions (Addendum)
Please complete lab work prior to leaving. Follow up in 1 year, sooner if problems/concerns.  

## 2017-12-20 NOTE — Progress Notes (Signed)
Subjective:    [name], female    DOB: 1955/10/12, 62 y.o.   MRN: [id number]  HPI  Patient presents today for complete physical.  Immunizations: tetanus up to date Diet: healthy Wt Readings from Last 3 Encounters:  12/20/17 138 lb 12.8 oz (63 kg)  11/29/16 147 lb 6.4 oz (66.9 kg)  11/10/16 146 lb 12.8 oz (66.6 kg)  Exercise: walks several times a week. Colonoscopy:  Due  Dexa: due Pap Smear: will complete 01/01/18 Mammogram:   5/19- normal per patient    Review of Systems  Constitutional: Negative for unexpected weight change.  HENT: Negative for hearing loss.   Eyes: Negative for visual disturbance.  Respiratory: Negative for cough and shortness of breath.   Cardiovascular: Negative for chest pain and leg swelling.  Genitourinary: Negative for dysuria, frequency and hematuria.  Musculoskeletal: Negative for arthralgias and myalgias.  Skin:       Reports some rashes/itching intermittently  Neurological: Negative for headaches.  Hematological: Negative for adenopathy.  Psychiatric/Behavioral:       Denies depression/anxiety   Past Medical History:  Diagnosis Date  . Acute bronchitis 04/11/2015  . Hyperlipidemia 01/11/2015  . Hypertension   . Vitamin D deficiency 01/29/2015     Social History   Socioeconomic History  . Marital status: Married    Spouse name: Not on file  . Number of children: Not on file  . Years of education: Not on file  . Highest education level: Not on file  Occupational History  . Not on file  Social Needs  . Financial resource strain: Not on file  . Food insecurity:    Worry: Not on file    Inability: Not on file  . Transportation needs:    Medical: Not on file    Non-medical: Not on file  Tobacco Use  . Smoking status: Former Smoker    Last attempt to quit: 1988    Years since quitting: 31.4  . Smokeless tobacco: Never Used  Substance and Sexual Activity  . Alcohol use: Yes    Alcohol/week: 0.6 - 1.2 oz   Types: 1 - 2 Cans of beer per week    Comment: has 2 drinks on weekend  . Drug use: No    Frequency: 1.0 times per week  . Sexual activity: Not on file  Lifestyle  . Physical activity:    Days per week: Not on file    Minutes per session: Not on file  . Stress: Not on file  Relationships  . Social connections:    Talks on phone: Not on file    Gets together: Not on file    Attends religious service: Not on file    Active member of club or organization: Not on file    Attends meetings of clubs or organizations: Not on file    Relationship status: Not on file  . Intimate partner violence:    Fear of current or ex partner: Not on file    Emotionally abused: Not on file    Physically abused: Not on file    Forced sexual activity: Not on file  Other Topics Concern  . Not on file  Social History Narrative   Married   Works At [employer]   2 children   [place] (daughter)- has 2 children (lives in [place])   [place] (1991)- lives will   Grew up in [place]    Past Surgical History:  Procedure Laterality Date  .  ECTOPIC PREGNANCY SURGERY  1985    Family History  Problem Relation Age of Onset  . Hypertension Father   . Cancer Father        prostate  . Hyperlipidemia Father   . Osteoarthritis Father   . Osteoarthritis Mother   . Cancer Maternal Grandfather 23       colon    No Known Allergies  Current Outpatient Medications on File Prior to Visit  Medication Sig Dispense Refill  . Calcium Carbonate-Vitamin D (CALTRATE 600+D) 600-400 MG-UNIT per tablet Take 1 tablet by mouth 2 (two) times daily.    . Cholecalciferol (VITAMIN D3) 3000 UNITS TABS Take 1 tablet by mouth daily. 30 tablet   . JEVANTIQUE LO 0.5-2.5 MG-MCG tablet Take 1 tablet by mouth daily.  3  . potassium chloride SA (K-DUR,KLOR-CON) 20 MEQ tablet TAKE 1 TABLET(20 MEQ) BY MOUTH TWICE DAILY 60 tablet 0  . triamterene-hydrochlorothiazide (DYAZIDE) 37.5-25 MG per capsule Take 1 capsule by  mouth daily.     No current facility-administered medications on file prior to visit.     BP 138/74 (BP Location: Right Arm, Patient Position: Sitting, Cuff Size: Small)   Pulse 83   Temp 99 F (37.2 C) (Oral)   Resp 16   Ht 5\' 2"  (1.575 m)   Wt 138 lb 12.8 oz (63 kg)   LMP 01/05/2005   SpO2 99%   BMI 25.39 kg/m        Objective:   Physical Exam  Physical Exam  Constitutional: She is oriented to person, place, and time. She appears well-developed and well-nourished. No distress.  HENT:  Head: Normocephalic and atraumatic.  Right Ear: Tympanic membrane and ear canal normal.  Left Ear: Tympanic membrane and ear canal normal.  Mouth/Throat: Oropharynx is clear and moist.  Eyes: Pupils are equal, round, and reactive to light. No scleral icterus.  Neck: Normal range of motion. No thyromegaly present.  Cardiovascular: Normal rate and regular rhythm.   No murmur heard. Pulmonary/Chest: Effort normal and breath sounds normal. No respiratory distress. He has no wheezes. She has no rales. She exhibits no tenderness.  Abdominal: Soft. Bowel sounds are normal. She exhibits no distension and no mass. There is no tenderness. There is no rebound and no guarding.  Musculoskeletal: She exhibits no edema.  Lymphadenopathy:    She has no cervical adenopathy.  Neurological: She is alert and oriented to person, place, and time. She has normal patellar reflexes. She exhibits normal muscle tone. Coordination normal.  Skin: Skin is warm and dry. pink colored rash beneath breasts Psychiatric: She has a normal mood and affect. Her behavior is normal. Judgment and thought content normal.  Breasts: Examined lying Right: Without masses, retractions, discharge or axillary adenopathy.  Left: Without masses, retractions, discharge or axillary adenopathy. Pelvic: deferred           Assessment & Plan:   Preventive care-discussed healthy diet and exercise.  Commended her on her weight loss.  We  spent time discussing the importance of the colonoscopy.  She is agreeable to proceed with this.  She is also candidate for the shingles vaccine but this is currently on national back order.  Will obtain routine lab work.  We will also obtain a follow-up bone density due to history of osteopenia. EKG tracing is personally reviewed.  EKG notes NSR.  No acute changes.         Assessment & Plan:

## 2017-12-21 LAB — URINALYSIS, ROUTINE W REFLEX MICROSCOPIC
Bilirubin Urine: NEGATIVE
HGB URINE DIPSTICK: NEGATIVE
Ketones, ur: 40 — AB
Leukocytes, UA: NEGATIVE
Nitrite: NEGATIVE
RBC / HPF: NONE SEEN (ref 0–?)
Specific Gravity, Urine: 1.025 (ref 1.000–1.030)
TOTAL PROTEIN, URINE-UPE24: NEGATIVE
URINE GLUCOSE: NEGATIVE
UROBILINOGEN UA: 0.2 (ref 0.0–1.0)
pH: 5.5 (ref 5.0–8.0)

## 2017-12-21 LAB — CBC WITH DIFFERENTIAL/PLATELET
Basophils Absolute: 0.1 10*3/uL (ref 0.0–0.1)
Basophils Relative: 1.1 % (ref 0.0–3.0)
EOS ABS: 0.1 10*3/uL (ref 0.0–0.7)
EOS PCT: 0.6 % (ref 0.0–5.0)
HCT: 46.8 % — ABNORMAL HIGH (ref 36.0–46.0)
HEMOGLOBIN: 15.7 g/dL — AB (ref 12.0–15.0)
LYMPHS ABS: 1.9 10*3/uL (ref 0.7–4.0)
Lymphocytes Relative: 18.1 % (ref 12.0–46.0)
MCHC: 33.6 g/dL (ref 30.0–36.0)
MCV: 94.3 fl (ref 78.0–100.0)
MONO ABS: 0.6 10*3/uL (ref 0.1–1.0)
Monocytes Relative: 5.5 % (ref 3.0–12.0)
NEUTROS PCT: 74.7 % (ref 43.0–77.0)
Neutro Abs: 7.7 10*3/uL (ref 1.4–7.7)
Platelets: 308 10*3/uL (ref 150.0–400.0)
RBC: 4.96 Mil/uL (ref 3.87–5.11)
RDW: 13.3 % (ref 11.5–15.5)
WBC: 10.3 10*3/uL (ref 4.0–10.5)

## 2017-12-21 LAB — BASIC METABOLIC PANEL
BUN: 13 mg/dL (ref 6–23)
CALCIUM: 10.5 mg/dL (ref 8.4–10.5)
CO2: 28 mEq/L (ref 19–32)
Chloride: 100 mEq/L (ref 96–112)
Creatinine, Ser: 0.76 mg/dL (ref 0.40–1.20)
GFR: 81.84 mL/min (ref >60.00)
GLUCOSE: 84 mg/dL (ref 70–99)
POTASSIUM: 4.2 meq/L (ref 3.5–5.1)
Sodium: 140 mEq/L (ref 135–145)

## 2017-12-21 LAB — HEPATIC FUNCTION PANEL
ALK PHOS: 49 U/L (ref 39–117)
ALT: 14 U/L (ref 0–35)
AST: 16 U/L (ref 0–37)
Albumin: 4.6 g/dL (ref 3.5–5.2)
BILIRUBIN DIRECT: 0.1 mg/dL (ref 0.0–0.3)
BILIRUBIN TOTAL: 0.6 mg/dL (ref 0.2–1.2)
Total Protein: 7.4 g/dL (ref 6.0–8.3)

## 2017-12-21 LAB — LIPID PANEL
CHOLESTEROL: 244 mg/dL — AB (ref 0–200)
HDL: 77.5 mg/dL (ref >39.00)
LDL CALC: 148 mg/dL — AB (ref 0–99)
NONHDL: 166.43
Total CHOL/HDL Ratio: 3
Triglycerides: 94 mg/dL (ref 0.0–149.0)
VLDL: 18.8 mg/dL (ref 0.0–40.0)

## 2017-12-21 LAB — TSH: TSH: 1.17 u[IU]/mL (ref 0.35–4.50)

## 2017-12-24 ENCOUNTER — Other Ambulatory Visit: Payer: No Typology Code available for payment source

## 2017-12-24 DIAGNOSIS — D649 Anemia, unspecified: Secondary | ICD-10-CM

## 2017-12-25 LAB — FERRITIN: Ferritin: 145 ng/mL (ref 16–288)

## 2017-12-26 ENCOUNTER — Encounter: Payer: Self-pay | Admitting: Family

## 2018-01-04 ENCOUNTER — Other Ambulatory Visit: Payer: Self-pay | Admitting: Family

## 2018-01-14 ENCOUNTER — Ambulatory Visit (HOSPITAL_BASED_OUTPATIENT_CLINIC_OR_DEPARTMENT_OTHER)
Admission: RE | Admit: 2018-01-14 | Discharge: 2018-01-14 | Disposition: A | Discharge status: Home / Self Care | Payer: No Typology Code available for payment source | Source: Ambulatory Visit | Attending: Family | Admitting: Family

## 2018-01-14 DIAGNOSIS — M8580 Other specified disorders of bone density and structure, unspecified site: Secondary | ICD-10-CM | POA: Diagnosis not present

## 2018-02-21 ENCOUNTER — Encounter: Payer: Self-pay | Admitting: Family

## 2018-04-10 ENCOUNTER — Other Ambulatory Visit: Payer: Self-pay

## 2018-04-10 MED ORDER — POTASSIUM CHLORIDE CRYS ER 20 MEQ PO TBCR: 20.0000 meq | EXTENDED_RELEASE_TABLET | Freq: Two times a day (BID) | ORAL | 2 refills | Status: DC

## 2018-04-21 ENCOUNTER — Encounter: Payer: Self-pay | Admitting: Family

## 2018-06-12 ENCOUNTER — Other Ambulatory Visit: Payer: Self-pay | Admitting: Family

## 2018-06-14 ENCOUNTER — Telehealth: Payer: Self-pay | Admitting: *Deleted

## 2018-06-14 NOTE — Telephone Encounter (Signed)
OK to fill, she has been stable on this combo. However she is due for follow up. Please contact pt to schedule follow up appointment.

## 2018-06-14 NOTE — Telephone Encounter (Signed)
Please advise 

## 2018-06-14 NOTE — Telephone Encounter (Signed)
Pharmacy is calling to report a possible medication interaction and they want to make sure the PCP is aware before they fill a medication:   Potasium chloride 20 MEQ Triamterene-hydrochlorothiazide 37.5-25 mg  Told them we would send for PCP review and call back with response. [phone] reference # [id number] Please let them know if OK to fill.

## 2018-06-19 NOTE — Telephone Encounter (Signed)
Clarification given to pharmacist for rx release.

## 2018-08-08 ENCOUNTER — Other Ambulatory Visit: Payer: Self-pay | Admitting: Family

## 2018-11-18 LAB — HM MAMMOGRAPHY

## 2019-01-26 ENCOUNTER — Other Ambulatory Visit: Payer: Self-pay | Admitting: Family

## 2019-02-11 ENCOUNTER — Other Ambulatory Visit: Payer: Self-pay | Admitting: Family

## 2019-02-13 NOTE — Telephone Encounter (Signed)
Called patient left msg to call back for appt

## 2019-02-13 NOTE — Telephone Encounter (Signed)
30 day supply sent.  Past due for follow up. Please contact pt to schedule follow up visit.

## 2019-07-28 ENCOUNTER — Other Ambulatory Visit: Payer: Self-pay

## 2019-07-29 ENCOUNTER — Other Ambulatory Visit: Payer: Self-pay

## 2019-07-29 ENCOUNTER — Ambulatory Visit (INDEPENDENT_AMBULATORY_CARE_PROVIDER_SITE_OTHER): Payer: PRIVATE HEALTH INSURANCE | Admitting: Family

## 2019-07-29 ENCOUNTER — Encounter: Payer: Self-pay | Admitting: Family

## 2019-07-29 VITALS — BP 143/85 | HR 86 | Temp 96.6°F | Resp 16 | Ht 62.0 in | Wt 146.0 lb

## 2019-07-29 DIAGNOSIS — I10 Essential (primary) hypertension: Secondary | ICD-10-CM | POA: Diagnosis not present

## 2019-07-29 DIAGNOSIS — Z23 Encounter for immunization: Secondary | ICD-10-CM

## 2019-07-29 DIAGNOSIS — H6122 Impacted cerumen, left ear: Secondary | ICD-10-CM | POA: Diagnosis not present

## 2019-07-29 DIAGNOSIS — E559 Vitamin D deficiency, unspecified: Secondary | ICD-10-CM

## 2019-07-29 DIAGNOSIS — Z0000 Encounter for general adult medical examination without abnormal findings: Secondary | ICD-10-CM

## 2019-07-29 DIAGNOSIS — M8580 Other specified disorders of bone density and structure, unspecified site: Secondary | ICD-10-CM

## 2019-07-29 LAB — CBC WITH DIFFERENTIAL/PLATELET
Basophils Absolute: 0.1 10*3/uL (ref 0.0–0.1)
Basophils Relative: 0.8 % (ref 0.0–3.0)
Eosinophils Absolute: 0.1 10*3/uL (ref 0.0–0.7)
Eosinophils Relative: 1.6 % (ref 0.0–5.0)
HCT: 44.6 % (ref 36.0–46.0)
Hemoglobin: 15 g/dL (ref 12.0–15.0)
Lymphocytes Relative: 23.3 % (ref 12.0–46.0)
Lymphs Abs: 1.7 10*3/uL (ref 0.7–4.0)
MCHC: 33.6 g/dL (ref 30.0–36.0)
MCV: 93.9 fl (ref 78.0–100.0)
Monocytes Absolute: 0.5 10*3/uL (ref 0.1–1.0)
Monocytes Relative: 7.2 % (ref 3.0–12.0)
Neutro Abs: 5 10*3/uL (ref 1.4–7.7)
Neutrophils Relative %: 67.1 % (ref 43.0–77.0)
Platelets: 305 10*3/uL (ref 150.0–400.0)
RBC: 4.75 Mil/uL (ref 3.87–5.11)
RDW: 13.2 % (ref 11.5–15.5)
WBC: 7.5 10*3/uL (ref 4.0–10.5)

## 2019-07-29 LAB — BASIC METABOLIC PANEL
BUN: 14 mg/dL (ref 6–23)
CO2: 31 mEq/L (ref 19–32)
Calcium: 10.7 mg/dL — ABNORMAL HIGH (ref 8.4–10.5)
Chloride: 100 mEq/L (ref 96–112)
Creatinine, Ser: 0.77 mg/dL (ref 0.40–1.20)
GFR: 75.46 mL/min (ref >60.00)
Glucose, Bld: 86 mg/dL (ref 70–99)
Potassium: 4 mEq/L (ref 3.5–5.1)
Sodium: 139 mEq/L (ref 135–145)

## 2019-07-29 LAB — LIPID PANEL
Cholesterol: 212 mg/dL — ABNORMAL HIGH (ref 0–200)
HDL: 80.4 mg/dL (ref >39.00)
LDL Cholesterol: 110 mg/dL — ABNORMAL HIGH (ref 0–99)
NonHDL: 131.46
Total CHOL/HDL Ratio: 3
Triglycerides: 107 mg/dL (ref 0.0–149.0)
VLDL: 21.4 mg/dL (ref 0.0–40.0)

## 2019-07-29 LAB — HEPATIC FUNCTION PANEL
ALT: 15 U/L (ref 0–35)
AST: 14 U/L (ref 0–37)
Albumin: 4.4 g/dL (ref 3.5–5.2)
Alkaline Phosphatase: 41 U/L (ref 39–117)
Bilirubin, Direct: 0.1 mg/dL (ref 0.0–0.3)
Total Bilirubin: 0.4 mg/dL (ref 0.2–1.2)
Total Protein: 6.8 g/dL (ref 6.0–8.3)

## 2019-07-29 LAB — TSH: TSH: 0.88 u[IU]/mL (ref 0.35–4.50)

## 2019-07-29 NOTE — Progress Notes (Signed)
Subjective:    [name], female    DOB: 1955-10-19, 64 y.o.   MRN: [id number]  HPI  Patient presents today for complete physical. She is planning to retire in the end of August.  Immunizations: flu shot up to  Date, tetanus up to date.  Due for shingrix.  Diet: healthy Exercise:  Some, needs more- walks some Colonoscopy: due Dexa: 01/14/18 Pap Smear: 11/18/18 Mammogram: 10/30/16 Vision: scheduled Dental: up to date  HTN- maintained on diazide.  This is being prescribed by GYN (Dr. Hinton Rao on [address]  BP Readings from Last 3 Encounters:  07/29/19 (!) 143/85  12/20/17 138/74  11/29/16 122/78    Wt Readings from Last 3 Encounters:  07/29/19 146 lb (66.2 kg)  12/20/17 138 lb 12.8 oz (63 kg)  11/29/16 147 lb 6.4 oz (66.9 kg)     Review of Systems  Constitutional: Negative for unexpected weight change.  HENT: Positive for hearing loss (? wax in the left ear). Negative for rhinorrhea.   Eyes: Negative for visual disturbance.  Respiratory: Negative for cough and shortness of breath.   Cardiovascular: Negative for chest pain.  Gastrointestinal: Negative for blood in stool, diarrhea and nausea.       Occasional gerd symptoms  Genitourinary: Negative for dysuria, frequency and hematuria.  Musculoskeletal: Negative for arthralgias.  Neurological: Negative for headaches.  Hematological: Negative for adenopathy.  Psychiatric/Behavioral:       Denies depression- had some stress over the fall (work/family)   Past Medical History:  Diagnosis Date  . Acute bronchitis 04/11/2015  . Hyperlipidemia 01/11/2015  . Hypertension   . Vitamin D deficiency 01/29/2015     Social History   Socioeconomic History  . Marital status: Married    Spouse name: Not on file  . Number of children: Not on file  . Years of education: Not on file  . Highest education level: Not on file  Occupational History  . Not on file  Tobacco Use  . Smoking status: Former Smoker   Quit date: 1988    Years since quitting: 33.0  . Smokeless tobacco: Never Used  Substance and Sexual Activity  . Alcohol use: Yes    Alcohol/week: 1.0 - 2.0 standard drinks    Types: 1 - 2 Cans of beer per week    Comment: has 2 drinks on weekend  . Drug use: No    Frequency: 1.0 times per week  . Sexual activity: Not on file  Other Topics Concern  . Not on file  Social History Narrative   Married   Works At [employer]   2 children   [place] (daughter)- has 2 children (lives in [place])   [place] (1991)- lives will   Grew up in [place] Determinants of [employer] Strain:   . Difficulty of Paying Living Expenses: Not on file  Food Insecurity:   . Worried About Charity fundraiser in the Last Year: Not on file  . Ran Out of Food in the Last Year: Not on file  Transportation Needs:   . Lack of Transportation (Medical): Not on file  . Lack of Transportation (Non-Medical): Not on file  Physical Activity:   . Days of Exercise per Week: Not on file  . Minutes of Exercise per Session: Not on file  Stress:   . Feeling of Stress : Not on file  Social Connections:   . Frequency of Communication  with Friends and Family: Not on file  . Frequency of Social Gatherings with Friends and Family: Not on file  . Attends Religious Services: Not on file  . Active Member of Clubs or Organizations: Not on file  . Attends Archivist Meetings: Not on file  . Marital Status: Not on file  Intimate Partner Violence:   . Fear of Current or Ex-Partner: Not on file  . Emotionally Abused: Not on file  . Physically Abused: Not on file  . Sexually Abused: Not on file    Past Surgical History:  Procedure Laterality Date  . ECTOPIC PREGNANCY SURGERY  1985    Family History  Problem Relation Age of Onset  . Hypertension Father   . Cancer Father        prostate  . Hyperlipidemia Father   . Osteoarthritis Father        died of covid-19 2021   . Osteoarthritis Mother   . Breast cancer Mother 29  . Cancer Maternal Grandfather 12       colon    No Known Allergies  Current Outpatient Medications on File Prior to Visit  Medication Sig Dispense Refill  . Calcium Carbonate-Vitamin D (CALTRATE 600+D) 600-400 MG-UNIT per tablet Take 1 tablet by mouth 2 (two) times daily.    . Cholecalciferol (VITAMIN D3) 3000 UNITS TABS Take 1 tablet by mouth daily. 30 tablet   . JEVANTIQUE LO 0.5-2.5 MG-MCG tablet Take 1 tablet by mouth daily.  3  . nystatin (MYCOSTATIN/NYSTOP) powder Apply topically 2 (two) times daily. 30 g 5  . nystatin cream (MYCOSTATIN) Apply 1 application topically 2 (two) times daily. 30 g 5  . potassium chloride SA (K-DUR) 20 MEQ tablet TAKE 1 TABLET BY MOUTH TWO  TIMES DAILY 30 tablet 0  . triamterene-hydrochlorothiazide (DYAZIDE) 37.5-25 MG per capsule Take 1 capsule by mouth daily.     No current facility-administered medications on file prior to visit.    BP (!) 143/85 (BP Location: Right Arm, Patient Position: Sitting, Cuff Size: Small)   Pulse 86   Temp (!) 96.6 F (35.9 C) (Temporal)   Resp 16   Ht 5\' 2"  (1.575 m)   Wt 146 lb (66.2 kg)   LMP 01/05/2005   SpO2 100%   BMI 26.70 kg/m       Objective:   Physical Exam  Physical Exam  Constitutional: She is oriented to person, place, and time. She appears well-developed and well-nourished. No distress.  HENT:  Head: Normocephalic and atraumatic.  Right Ear: Tympanic membrane and ear canal normal.  Left Ear: cerumen impaction Mouth/Throat: Not examined Eyes: Pupils are equal, round, and reactive to light. No scleral icterus.  Neck: Normal range of motion. No thyromegaly present.  Cardiovascular: Normal rate and regular rhythm.   No murmur heard. Pulmonary/Chest: Effort normal and breath sounds normal. No respiratory distress. He has no wheezes. She has no rales. She exhibits no tenderness.  Abdominal: Soft. Bowel sounds are normal. She exhibits no  distension and no mass. There is no tenderness. There is no rebound and no guarding.  Musculoskeletal: She exhibits no edema.  Lymphadenopathy:    She has no cervical adenopathy.  Neurological: She is alert and oriented to person, place, and time. She has normal patellar reflexes. She exhibits normal muscle tone. Coordination normal.  Skin: Skin is warm and dry.  Psychiatric: She has a normal mood and affect. Her behavior is normal. Judgment and thought content normal.  Breast/pelvic: deferred  Assessment & Plan:   Preventative care- discussed healthy diet, exercise.  Mammo/pap up to date.  Due for colo. Referral has been placed. Obtain routine lab work. Shingrix #1 today.  Cerumen Impaction- CMA flushed left ear for pt. Pt tolerated procedure without difficulty.  HTN- fair BP- management per GYN.    Osteopenia- mild, continue calcium supplement.   This visit occurred during the SARS-CoV-2 public health emergency.  Safety protocols were in place, including screening questions prior to the visit, additional usage of staff PPE, and extensive cleaning of exam room while observing appropriate contact time as indicated for disinfecting solutions.       Assessment & Plan:

## 2019-07-29 NOTE — Patient Instructions (Signed)
Please complete lab work prior to leaving.   

## 2019-07-31 LAB — VITAMIN D 1,25 DIHYDROXY
Vitamin D 1, 25 (OH)2 Total: 25 pg/mL (ref 18–72)
Vitamin D2 1, 25 (OH)2: <8 pg/mL
Vitamin D3 1, 25 (OH)2: 25 pg/mL

## 2019-08-01 ENCOUNTER — Telehealth: Payer: Self-pay | Admitting: Family

## 2019-08-01 NOTE — Telephone Encounter (Signed)
Calcium level a little elevated. I would like her to stop caltrate and return for labs in 2 weeks.

## 2019-08-01 NOTE — Telephone Encounter (Signed)
Also, cholesterol is still elevated but looks much better than last time it was checked.

## 2019-08-01 NOTE — Telephone Encounter (Signed)
Lvm for patient to call back on Monday for results

## 2019-08-04 NOTE — Telephone Encounter (Signed)
Patient advised of results and to dc calcium supplements. She was scheduled for labs on 08-25-2019

## 2019-08-25 ENCOUNTER — Other Ambulatory Visit: Payer: Self-pay

## 2019-08-25 ENCOUNTER — Other Ambulatory Visit (INDEPENDENT_AMBULATORY_CARE_PROVIDER_SITE_OTHER): Payer: PRIVATE HEALTH INSURANCE

## 2019-08-26 LAB — PTH, INTACT AND CALCIUM
Calcium: 9.7 mg/dL (ref 8.6–10.4)
PTH: 48 pg/mL (ref 14–64)

## 2019-08-29 ENCOUNTER — Encounter: Payer: Self-pay | Admitting: Family

## 2019-09-26 ENCOUNTER — Encounter: Payer: Self-pay | Admitting: Family

## 2019-10-07 ENCOUNTER — Ambulatory Visit: Payer: PRIVATE HEALTH INSURANCE

## 2019-10-21 ENCOUNTER — Ambulatory Visit (INDEPENDENT_AMBULATORY_CARE_PROVIDER_SITE_OTHER): Payer: PRIVATE HEALTH INSURANCE

## 2019-10-21 ENCOUNTER — Other Ambulatory Visit: Payer: Self-pay

## 2019-10-21 DIAGNOSIS — Z23 Encounter for immunization: Secondary | ICD-10-CM

## 2019-10-21 NOTE — Progress Notes (Signed)
Patient here today for shingrix vaccine per [facility].  0.53mL given in left deltoid IM. Patient tolerated well. VIS given.

## 2019-10-28 ENCOUNTER — Other Ambulatory Visit: Payer: Self-pay

## 2019-10-28 ENCOUNTER — Encounter: Payer: Self-pay | Admitting: Family

## 2019-10-28 ENCOUNTER — Ambulatory Visit: Payer: PRIVATE HEALTH INSURANCE | Admitting: Family

## 2019-10-28 VITALS — BP 123/71 | HR 82 | Temp 97.2°F | Resp 16 | Ht 62.0 in | Wt 141.0 lb

## 2019-10-28 DIAGNOSIS — L304 Erythema intertrigo: Secondary | ICD-10-CM | POA: Diagnosis not present

## 2019-10-28 MED ORDER — NYSTATIN 100000 UNIT/GM EX CREA: 1.0000 "application " | TOPICAL_CREAM | Freq: Two times a day (BID) | CUTANEOUS | 5 refills | Status: DC

## 2019-10-28 MED ORDER — FLUCONAZOLE 150 MG PO TABS: ORAL_TABLET | ORAL | 0 refills | Status: DC

## 2019-10-28 NOTE — Patient Instructions (Signed)
Please begin Diflucan once weekly for 4 weeks then switch to as needed nystatin powder or cream.  Call if rash worsens or if rash fails to improve.

## 2019-10-28 NOTE — Progress Notes (Signed)
Subjective:    [name], female    DOB: [birth year], 64 y.o.   MRN: [id number]  HPI  Patient is 64 yr old female who presents today with chief complaint of rash. Reports that rash "never really goes away." Worse in the heat.  Notes that rash is itchy. Has been applying topical nystatin without significant improvement.   Review of Systems See HPI  Past Medical History:  Diagnosis Date  . Acute bronchitis 04/11/2015  . Hyperlipidemia 01/11/2015  . Hypertension   . Vitamin D deficiency 01/29/2015     Social History   Socioeconomic History  . Marital status: Married    Spouse name: Not on file  . Number of children: Not on file  . Years of education: Not on file  . Highest education level: Not on file  Occupational History  . Not on file  Tobacco Use  . Smoking status: Former Smoker    Quit date: 1988    Years since quitting: 33.3  . Smokeless tobacco: Never Used  Substance and Sexual Activity  . Alcohol use: Yes    Alcohol/week: 1.0 - 2.0 standard drinks    Types: 1 - 2 Cans of beer per week    Comment: has 2 drinks on weekend  . Drug use: No    Frequency: 1.0 times per week  . Sexual activity: Not on file  Other Topics Concern  . Not on file  Social History Narrative   Married   Works At [employer]   2 children   [place] (daughter)- has 2 children (lives in [place])   [place] (1991)- lives will   Grew up in [place] Determinants of [employer] Strain:   . Difficulty of Paying Living Expenses:   Food Insecurity:   . Worried About Charity fundraiser in the Last Year:   . Arboriculturist in the Last Year:   Transportation Needs:   . Film/video editor (Medical):   Marland Kitchen Lack of Transportation (Non-Medical):   Physical Activity:   . Days of Exercise per Week:   . Minutes of Exercise per Session:   Stress:   . Feeling of Stress :   Social Connections:   . Frequency of Communication with Friends and  Family:   . Frequency of Social Gatherings with Friends and Family:   . Attends Religious Services:   . Active Member of Clubs or Organizations:   . Attends Archivist Meetings:   Marland Kitchen Marital Status:   Intimate Partner Violence:   . Fear of Current or Ex-Partner:   . Emotionally Abused:   Marland Kitchen Physically Abused:   . Sexually Abused:     Past Surgical History:  Procedure Laterality Date  . ECTOPIC PREGNANCY SURGERY  1985    Family History  Problem Relation Age of Onset  . Hypertension Father   . Cancer Father        prostate  . Hyperlipidemia Father   . Osteoarthritis Father        died of covid-19 2021  . Osteoarthritis Mother   . Breast cancer Mother 39  . Cancer Maternal Grandfather 20       colon    No Known Allergies  Current Outpatient Medications on File Prior to Visit  Medication Sig Dispense Refill  . Cholecalciferol (VITAMIN D3) 3000 UNITS TABS Take 1 tablet by mouth daily. 30 tablet   . JEVANTIQUE LO 0.5-2.5  MG-MCG tablet Take 1 tablet by mouth daily.  3  . nystatin (MYCOSTATIN/NYSTOP) powder Apply topically 2 (two) times daily. 30 g 5  . nystatin cream (MYCOSTATIN) Apply 1 application topically 2 (two) times daily. 30 g 5  . potassium chloride SA (K-DUR) 20 MEQ tablet TAKE 1 TABLET BY MOUTH TWO  TIMES DAILY 30 tablet 0  . triamterene-hydrochlorothiazide (DYAZIDE) 37.5-25 MG per capsule Take 1 capsule by mouth daily.     No current facility-administered medications on file prior to visit.    BP 123/71 (BP Location: Right Arm, Patient Position: Sitting, Cuff Size: Small)   Pulse 82   Temp (!) 97.2 F (36.2 C) (Temporal)   Resp 16   Ht 5\' 2"  (1.575 m)   Wt 141 lb (64 kg)   LMP 01/05/2005   SpO2 99%   BMI 25.79 kg/m       Objective:   Physical Exam Constitutional:      Appearance: She is well-developed.  Neck:     Thyroid: No thyromegaly.  Cardiovascular:     Rate and Rhythm: Normal rate and regular rhythm.     Heart sounds: Normal heart  sounds. No murmur.  Pulmonary:     Effort: Pulmonary effort is normal. No respiratory distress.     Breath sounds: Normal breath sounds. No wheezing.  Musculoskeletal:     Cervical back: Neck supple.  Skin:    General: Skin is warm and dry.     Comments: + erythematous rash noted beneath both breasts  Neurological:     Mental Status: She is alert and oriented to person, place, and time.  Psychiatric:        Behavior: Behavior normal.        Thought Content: Thought content normal.        Judgment: Judgment normal.           Assessment & Plan:  Intertrigo- uncontrolled. Will rx with oral diflucan once weekly for 4 weeks. She will then switch back to topical nystatin.  She is advised to call if symptoms worsen or if symptoms fail to improve.

## 2019-11-19 LAB — HM MAMMOGRAPHY

## 2020-04-08 ENCOUNTER — Encounter: Payer: Self-pay | Admitting: Family

## 2020-04-08 DIAGNOSIS — Z1211 Encounter for screening for malignant neoplasm of colon: Secondary | ICD-10-CM

## 2020-05-18 ENCOUNTER — Encounter: Payer: Self-pay | Admitting: Gastroenterology

## 2020-06-03 LAB — HM PAP SMEAR: HM Pap smear: NEGATIVE

## 2020-07-05 ENCOUNTER — Ambulatory Visit (AMBULATORY_SURGERY_CENTER): Payer: PRIVATE HEALTH INSURANCE | Admitting: *Deleted

## 2020-07-05 ENCOUNTER — Other Ambulatory Visit: Payer: Self-pay

## 2020-07-05 VITALS — Ht 62.0 in | Wt 143.0 lb

## 2020-07-05 DIAGNOSIS — Z1211 Encounter for screening for malignant neoplasm of colon: Secondary | ICD-10-CM

## 2020-07-05 MED ORDER — CLENPIQ 10-3.5-12 MG-GM -GM/160ML PO SOLN: 1.0000 | ORAL | 0 refills | Status: DC

## 2020-07-05 NOTE — Progress Notes (Signed)
Pt verified name, DOB, address and insurance during PV today. Pt mailed instruction packet to included paper to complete and mail back to [facility] with addressed and stamped envelope, Emmi video, copy of consent form to read and not return, and instructions. Clenpiq coupon mailed in packet. PV completed over the phone. Pt encouraged to call with questions or issues - instructions via My Chart as well   No egg or soy allergy known to patient  No issues with past sedation with any surgeries or procedures No intubation problems in the past  No FH of Malignant Hyperthermia No diet pills per patient No home 02 use per patient  No blood thinners per patient  Pt denies issues with constipation  No A fib or A flutter  EMMI video to pt or via MyChart  COVID 19 guidelines implemented in PV today with Pt and RN  Pt is fully vaccinated  for [employer] given to pt in PV today , Code to Pharmacy   Due to the COVID-19 pandemic we are asking patients to follow certain guidelines.  Pt aware of COVID protocols and [facility] guidelines

## 2020-07-15 ENCOUNTER — Encounter: Payer: Self-pay | Admitting: Gastroenterology

## 2020-07-20 ENCOUNTER — Encounter: Payer: PRIVATE HEALTH INSURANCE | Admitting: Gastroenterology

## 2020-08-17 ENCOUNTER — Ambulatory Visit (AMBULATORY_SURGERY_CENTER): Payer: Medicare Other | Admitting: Gastroenterology

## 2020-08-17 ENCOUNTER — Other Ambulatory Visit: Payer: Self-pay

## 2020-08-17 ENCOUNTER — Encounter: Payer: Self-pay | Admitting: Gastroenterology

## 2020-08-17 VITALS — BP 127/76 | HR 74 | Temp 97.0°F | Resp 18 | Ht 62.0 in | Wt 143.0 lb

## 2020-08-17 DIAGNOSIS — D125 Benign neoplasm of sigmoid colon: Secondary | ICD-10-CM

## 2020-08-17 DIAGNOSIS — Z1211 Encounter for screening for malignant neoplasm of colon: Secondary | ICD-10-CM | POA: Diagnosis not present

## 2020-08-17 DIAGNOSIS — Z800 Family history of malignant neoplasm of digestive organs: Secondary | ICD-10-CM | POA: Diagnosis not present

## 2020-08-17 MED ORDER — SODIUM CHLORIDE 0.9 % IV SOLN: 500.0000 mL | Freq: Once | INTRAVENOUS | Status: DC

## 2020-08-17 NOTE — Patient Instructions (Signed)
Handouts provided:  Polyps  NO aspirin, ibuprofen, naproxen, or other non-steroidal anti-inflammatory drugs for days after polyp removal.   YOU HAD AN ENDOSCOPIC PROCEDURE TODAY AT [facility]:   Refer to the procedure report that was given to you for any specific questions about what was found during the examination.  If the procedure report does not answer your questions, please call your gastroenterologist to clarify.  If you requested that your care partner not be given the details of your procedure findings, then the procedure report has been included in a sealed envelope for you to review at your convenience later.  YOU SHOULD EXPECT: Some feelings of bloating in the abdomen. Passage of more gas than usual.  Walking can help get rid of the air that was put into your GI tract during the procedure and reduce the bloating. If you had a lower endoscopy (such as a colonoscopy or flexible sigmoidoscopy) you may notice spotting of blood in your stool or on the toilet paper. If you underwent a bowel prep for your procedure, you may not have a normal bowel movement for a few days.  Please Note:  You might notice some irritation and congestion in your nose or some drainage.  This is from the oxygen used during your procedure.  There is no need for concern and it should clear up in a day or so.  SYMPTOMS TO REPORT IMMEDIATELY:   Following lower endoscopy (colonoscopy or flexible sigmoidoscopy):  Excessive amounts of blood in the stool  Significant tenderness or worsening of abdominal pains  Swelling of the abdomen that is new, acute  Fever of 100F or higher  For urgent or emergent issues, a gastroenterologist can be reached at any hour by calling [phone]. Do not use MyChart messaging for urgent concerns.    DIET:  We do recommend a small meal at first, but then you may proceed to your regular diet.  Drink plenty of fluids but you should avoid alcoholic beverages for 24  hours.  ACTIVITY:  You should plan to take it easy for the rest of today and you should NOT DRIVE or use heavy machinery until tomorrow (because of the sedation medicines used during the test).    FOLLOW UP: Our staff will call the number listed on your records 48-72 hours following your procedure to check on you and address any questions or concerns that you may have regarding the information given to you following your procedure. If we do not reach you, we will leave a message.  We will attempt to reach you two times.  During this call, we will ask if you have developed any symptoms of COVID 19. If you develop any symptoms (ie: fever, flu-like symptoms, shortness of breath, cough etc.) before then, please call [phone].  If you test positive for Covid 19 in the 2 weeks post procedure, please call and report this information to [place].    If any biopsies were taken you will be contacted by phone or by letter within the next 1-3 weeks.  Please call us at [phone] if you have not heard about the biopsies in 3 weeks.    SIGNATURES/CONFIDENTIALITY: You and/or your care partner have signed paperwork which will be entered into your electronic medical record.  These signatures attest to the fact that that the information above on your After Visit Summary has been reviewed and is understood.  Full responsibility of the confidentiality of this discharge information lies with you and/or  your care-partner.

## 2020-08-17 NOTE — Progress Notes (Signed)
A and O x3. Report to RN. Tolerated MAC anesthesia well.

## 2020-08-17 NOTE — Progress Notes (Signed)
Pt's states no medical or surgical changes since previsit or office visit. 

## 2020-08-17 NOTE — Progress Notes (Signed)
Called to room to assist during endoscopic procedure.  Patient ID and intended procedure confirmed with present staff. Received instructions for my participation in the procedure from the performing physician.  

## 2020-08-17 NOTE — Op Note (Signed)
[facility] Patient Name: Jeanette Petty Procedure Date: 08/17/2020 8:04 AM MRN: [id number] Endoscopist: Jackquline Denmark , MD Age: 65 Referring MD:  Date of Birth: April 17, 1956 Gender: Female Account #: [id number] Procedure:                Colonoscopy Indications:              Screening for colorectal malignant neoplasm. FH of                            colon cancer in a second-degree relative (GF) Medicines:                Monitored Anesthesia Care Procedure:                Pre-Anesthesia Assessment:                           - Prior to the procedure, a History and Physical                            was performed, and patient medications and                            allergies were reviewed. The patient's tolerance of                            previous anesthesia was also reviewed. The risks                            and benefits of the procedure and the sedation                            options and risks were discussed with the patient.                            All questions were answered, and informed consent                            was obtained. Prior Anticoagulants: The patient has                            taken no previous anticoagulant or antiplatelet                            agents. ASA Grade Assessment: II - A patient with                            mild systemic disease. After reviewing the risks                            and benefits, the patient was deemed in                            satisfactory condition to undergo the procedure.  After obtaining informed consent, the colonoscope                            was passed under direct vision. Throughout the                            procedure, the patient's blood pressure, pulse, and                            oxygen saturations were monitored continuously. The                            Olympus PCF-H190DL (#[id number]) Colonoscope was                            introduced through  the anus and advanced to the 2                            cm into the ileum. The colonoscopy was performed                            without difficulty. The patient tolerated the                            procedure well. The quality of the bowel                            preparation was good. The terminal ileum, ileocecal                            valve, appendiceal orifice, and rectum were                            photographed. Scope In: 8:05:45 AM Scope Out: 8:21:13 AM Scope Withdrawal Time: 0 hours 12 minutes 40 seconds  Total Procedure Duration: 0 hours 15 minutes 28 seconds  Findings:                 A 10 mm polyp was found in the proximal sigmoid                            colon, 45 cm from the anal verge. The polyp was                            sessile. The polyp was removed with a hot snare.                            Resection and retrieval were complete. Estimated                            blood loss: none.                           A few small-mouthed diverticula were found in the  sigmoid colon and ascending colon.                           Non-bleeding internal hemorrhoids were found during                            retroflexion. The hemorrhoids were small.                           The terminal ileum appeared normal.                           The exam was otherwise without abnormality on                            direct and retroflexion views. Complications:            No immediate complications. Estimated Blood Loss:     Estimated blood loss: none. Impression:               - One 10 mm polyp in the proximal sigmoid colon,                            removed with a hot snare. Resected and retrieved.                           - Minimal colonic diverticulosis.                           - Non-bleeding internal hemorrhoids.                           - The examined portion of the ileum was normal.                           - The examination  was otherwise normal on direct                            and retroflexion views. Recommendation:           - Patient has a contact number available for                            emergencies. The signs and symptoms of potential                            delayed complications were discussed with the                            patient. Return to normal activities tomorrow.                            Written discharge instructions were provided to the                            patient.                           -  Resume previous diet.                           - Continue present medications.                           - Await pathology results.                           - No aspirin, ibuprofen, naproxen, or other                            non-steroidal anti-inflammatory drugs for 5 days                            after polyp removal.                           - Repeat colonoscopy for surveillance based on                            pathology results.                           - The findings and recommendations were discussed                            with the patient's husband Nadara Mustard. Jackquline Denmark, MD 08/17/2020 8:30:26 AM This report has been signed electronically.

## 2020-08-19 ENCOUNTER — Telehealth: Payer: Self-pay | Admitting: *Deleted

## 2020-08-19 ENCOUNTER — Telehealth: Payer: Self-pay

## 2020-08-19 NOTE — Telephone Encounter (Signed)
  Follow up Call-  Call back number 08/17/2020  Post procedure Call Back phone  # [phone]  Permission to leave phone message Yes  Some recent data might be hidden

## 2020-08-19 NOTE — Telephone Encounter (Signed)
NO ANSWER, MESSAGE LEFT FOR PATIENT. 

## 2020-08-23 ENCOUNTER — Other Ambulatory Visit: Payer: Self-pay | Admitting: Family

## 2020-08-23 ENCOUNTER — Encounter: Payer: Self-pay | Admitting: Family

## 2020-08-23 ENCOUNTER — Encounter: Payer: Self-pay | Admitting: Gastroenterology

## 2020-08-23 MED ORDER — POTASSIUM CHLORIDE CRYS ER 20 MEQ PO TBCR: 20.0000 meq | EXTENDED_RELEASE_TABLET | Freq: Two times a day (BID) | ORAL | 1 refills | Status: DC

## 2020-08-24 NOTE — Telephone Encounter (Signed)
Form received, will give to provider to select one of the 2 options sent by pharmacy

## 2020-08-26 MED ORDER — POTASSIUM CHLORIDE CRYS ER 20 MEQ PO TBCR: 20.0000 meq | EXTENDED_RELEASE_TABLET | Freq: Two times a day (BID) | ORAL | 1 refills | Status: DC

## 2020-08-26 NOTE — Telephone Encounter (Signed)
Patient calling back in regards to medication. Pharmacist need clarification on rx

## 2020-09-10 ENCOUNTER — Encounter: Payer: Self-pay | Admitting: Family

## 2020-09-10 ENCOUNTER — Ambulatory Visit (INDEPENDENT_AMBULATORY_CARE_PROVIDER_SITE_OTHER): Payer: Medicare Other | Admitting: Family

## 2020-09-10 ENCOUNTER — Other Ambulatory Visit: Payer: Self-pay

## 2020-09-10 ENCOUNTER — Telehealth: Payer: Self-pay | Admitting: Family

## 2020-09-10 DIAGNOSIS — N951 Menopausal and female climacteric states: Secondary | ICD-10-CM | POA: Diagnosis not present

## 2020-09-10 DIAGNOSIS — E785 Hyperlipidemia, unspecified: Secondary | ICD-10-CM

## 2020-09-10 DIAGNOSIS — G4700 Insomnia, unspecified: Secondary | ICD-10-CM

## 2020-09-10 DIAGNOSIS — Z23 Encounter for immunization: Secondary | ICD-10-CM

## 2020-09-10 DIAGNOSIS — Z0000 Encounter for general adult medical examination without abnormal findings: Secondary | ICD-10-CM | POA: Diagnosis not present

## 2020-09-10 DIAGNOSIS — D75839 Thrombocytosis, unspecified: Secondary | ICD-10-CM | POA: Diagnosis not present

## 2020-09-10 LAB — CBC WITH DIFFERENTIAL/PLATELET
Basophils Absolute: 0.1 K/uL (ref 0.0–0.1)
Basophils Relative: 1.1 % (ref 0.0–3.0)
Eosinophils Absolute: 0.1 K/uL (ref 0.0–0.7)
Eosinophils Relative: 0.7 % (ref 0.0–5.0)
HCT: 44.8 % (ref 36.0–46.0)
Hemoglobin: 15.1 g/dL — ABNORMAL HIGH (ref 12.0–15.0)
Lymphocytes Relative: 21.5 % (ref 12.0–46.0)
Lymphs Abs: 1.6 K/uL (ref 0.7–4.0)
MCHC: 33.6 g/dL (ref 30.0–36.0)
MCV: 93.1 fl (ref 78.0–100.0)
Monocytes Absolute: 0.5 K/uL (ref 0.1–1.0)
Monocytes Relative: 7.1 % (ref 3.0–12.0)
Neutro Abs: 5.3 K/uL (ref 1.4–7.7)
Neutrophils Relative %: 69.6 % (ref 43.0–77.0)
Platelets: 304 K/uL (ref 150.0–400.0)
RBC: 4.81 Mil/uL (ref 3.87–5.11)
RDW: 13.5 % (ref 11.5–15.5)
WBC: 7.6 K/uL (ref 4.0–10.5)

## 2020-09-10 LAB — COMPREHENSIVE METABOLIC PANEL
ALT: 15 U/L (ref 0–35)
AST: 16 U/L (ref 0–37)
Albumin: 4.4 g/dL (ref 3.5–5.2)
Alkaline Phosphatase: 49 U/L (ref 39–117)
BUN: 11 mg/dL (ref 6–23)
CO2: 27 mEq/L (ref 19–32)
Calcium: 9.9 mg/dL (ref 8.4–10.5)
Chloride: 101 mEq/L (ref 96–112)
Creatinine, Ser: 0.73 mg/dL (ref 0.40–1.20)
GFR: 86.45 mL/min (ref >60.00)
Glucose, Bld: 83 mg/dL (ref 70–99)
Potassium: 3.9 mEq/L (ref 3.5–5.1)
Sodium: 139 mEq/L (ref 135–145)
Total Bilirubin: 0.5 mg/dL (ref 0.2–1.2)
Total Protein: 7.2 g/dL (ref 6.0–8.3)

## 2020-09-10 LAB — LIPID PANEL
Cholesterol: 233 mg/dL — ABNORMAL HIGH (ref 0–200)
HDL: 74.8 mg/dL (ref >39.00)
LDL Cholesterol: 133 mg/dL — ABNORMAL HIGH (ref 0–99)
NonHDL: 157.93
Total CHOL/HDL Ratio: 3
Triglycerides: 126 mg/dL (ref 0.0–149.0)
VLDL: 25.2 mg/dL (ref 0.0–40.0)

## 2020-09-10 MED ORDER — NORETHINDRONE-ETH ESTRADIOL 0.5-2.5 MG-MCG PO TABS: 1.0000 | ORAL_TABLET | Freq: Every day | ORAL | 1 refills | Status: DC

## 2020-09-10 NOTE — Progress Notes (Signed)
Subjective:    [name], female    DOB: 1955-10-19, 65 y.o.   MRN: [id number]  HPI  Patient is a 65 yr old female who presents today for cpx.  Immunizations: She had 3 covid shots.  Tetanus up to date. Flu shot up to date- she will send [place] these date.   Diet:  healthy Exercise: walks regularly Colonoscopy: 08/17/20 Dexa: 2019 Pap Smear:  Last year- gyn Mammogram: 5/21 Vision: up to date Dental: up to date   Insomnia-  She has not taken any medications.  She retired in August.  She wonders if insomnia is due to adjustments.  Dad passed away.  Husband has AF.    Review of Systems  Constitutional: Negative for unexpected weight change.  HENT: Negative for hearing loss and rhinorrhea.   Eyes: Negative for visual disturbance.  Respiratory: Negative for cough and shortness of breath.   Cardiovascular: Negative for chest pain.  Gastrointestinal: Negative for constipation, diarrhea and nausea.  Genitourinary: Negative for dysuria, frequency and hematuria.  Musculoskeletal: Positive for arthralgias (mild right elbow pain). Negative for myalgias.  Skin: Negative for rash.  Neurological: Negative for headaches.  Hematological: Negative for adenopathy.  Psychiatric/Behavioral:       Denies depression/anxiety   Past Medical History:  Diagnosis Date  . Acute bronchitis 04/11/2015  . GERD (gastroesophageal reflux disease)    PRN Tums   . Hyperlipidemia 01/11/2015  . Hypertension   . Vitamin D deficiency 01/29/2015     Social History   Socioeconomic History  . Marital status: Married    Spouse name: Not on file  . Number of children: Not on file  . Years of education: Not on file  . Highest education level: Not on file  Occupational History  . Not on file  Tobacco Use  . Smoking status: Former Smoker    Quit date: 1988    Years since quitting: 34.2  . Smokeless tobacco: Never Used  Substance and Sexual Activity  . Alcohol use: Yes    Alcohol/week: 1.0 -  2.0 standard drink    Types: 1 - 2 Cans of beer per week    Comment: has 2 drinks on weekend  . Drug use: No    Frequency: 1.0 times per week  . Sexual activity: Yes    Partners: Male  Other Topics Concern  . Not on file  Social History Narrative   Married   Works At [employer]   2 children   [place] (daughter)- has 2 children (lives in [place])   [place] (1991)- lives will   Grew up in [place] Determinants of Radio broadcast assistant Strain: Not on [employer] Insecurity: Not on file  Transportation Needs: Not on file  Physical Activity: Not on file  Stress: Not on file  Social Connections: Not on file  Intimate Partner Violence: Not on file    Past Surgical History:  Procedure Laterality Date  . ECTOPIC PREGNANCY SURGERY  1985    Family History  Problem Relation Age of Onset  . Hypertension Father   . Cancer Father        prostate  . Hyperlipidemia Father   . Osteoarthritis Father        died of covid-19 2021  . Prostate cancer Father   . Osteoarthritis Mother   . Breast cancer Mother 59  . Cancer Maternal Grandfather 66       colon  .  Colon polyps Maternal Grandfather   . Esophageal cancer Neg Hx   . Rectal cancer Neg Hx   . Stomach cancer Neg Hx   . Crohn's disease Neg Hx     No Known Allergies  Current Outpatient Medications on File Prior to Visit  Medication Sig Dispense Refill  . Cholecalciferol (VITAMIN D3) 3000 UNITS TABS Take 1 tablet by mouth daily. 30 tablet   . potassium chloride SA (KLOR-CON) 20 MEQ tablet Take 1 tablet (20 mEq total) by mouth 2 (two) times daily. 180 tablet 1  . triamterene-hydrochlorothiazide (DYAZIDE) 37.5-25 MG per capsule Take 1 capsule by mouth daily.     No current facility-administered medications on file prior to visit.    BP 133/67 (BP Location: Right Arm, Patient Position: Sitting, Cuff Size: Small)   Pulse 84   Temp 98.6 F (37 C) (Oral)   Resp 16   Ht 5\' 2"  (1.575 m)   Wt 143  lb (64.9 kg)   LMP 01/05/2005   SpO2 100%   BMI 26.16 kg/m       Objective:   Physical Exam Constitutional:      Appearance: She is well-developed.  HENT:     Head: Normocephalic and atraumatic.     Right Ear: Tympanic membrane and ear canal normal.     Left Ear: Tympanic membrane and ear canal normal.  Eyes:     Extraocular Movements: Extraocular movements intact.     Pupils: Pupils are equal, round, and reactive to light.  Neck:     Thyroid: No thyromegaly.  Cardiovascular:     Rate and Rhythm: Normal rate and regular rhythm.     Heart sounds: Normal heart sounds. No murmur heard.   Pulmonary:     Effort: Pulmonary effort is normal. No respiratory distress.     Breath sounds: Normal breath sounds. No wheezing.  Abdominal:     General: There is no distension.     Palpations: Abdomen is soft.     Tenderness: There is no abdominal tenderness.  Musculoskeletal:     Cervical back: Neck supple.  Skin:    General: Skin is warm and dry.  Neurological:     General: No focal deficit present.     Mental Status: She is alert and oriented to person, place, and time.  Psychiatric:        Behavior: Behavior normal.        Thought Content: Thought content normal.        Judgment: Judgment normal.           Assessment & Plan:  Preventative care- encouraged her to continue healthy diet, regular exercise. Will request copy of her pap and mammo from GYN. Pneumovax 23 today. She will send [place] the dates for her covid vaccinations and her flu shot.  Insomnia- recommended that she avoid screens for 1.5 hrs before bed. Add melatonin prn.  Post-menopausal status- she is requesting a refill on her HRT as her GYN is no longer on her plan.   This visit occurred during the SARS-CoV-2 public health emergency.  Safety protocols were in place, including screening questions prior to the visit, additional usage of staff PPE, and extensive cleaning of exam room while observing appropriate  contact time as indicated for disinfecting solutions.

## 2020-09-10 NOTE — Telephone Encounter (Signed)
Records updated.

## 2020-09-10 NOTE — Telephone Encounter (Signed)
Hinton Rao MD- GYN, please request last pap.

## 2020-09-10 NOTE — Telephone Encounter (Signed)
Immunization record updated.

## 2020-09-10 NOTE — Telephone Encounter (Signed)
Records release faxed to GYN

## 2020-09-12 ENCOUNTER — Telehealth: Payer: Self-pay | Admitting: Family

## 2020-09-12 NOTE — Telephone Encounter (Signed)
See mychart.  

## 2020-10-27 NOTE — Telephone Encounter (Signed)
  Follow up Call-  Call back number 08/17/2020  Post procedure Call Back phone  # [phone]  Permission to leave phone message Yes  Some recent data might be hidden     Patient questions:  Do you have a fever, pain , or abdominal swelling? No. Pain Score  0 *  Have you tolerated food without any problems? Yes.    Have you been able to return to your normal activities? Yes.    Do you have any questions about your discharge instructions: Diet   No. Medications  No. Follow up visit  No.  Do you have questions or concerns about your Care? No.  Actions: * If pain score is 4 or above: No action needed, pain <4.  1. Have you developed a fever since your procedure? no  2.   Have you had an respiratory symptoms (SOB or cough) since your procedure? no  3.   Have you tested positive for COVID 19 since your procedureno  4.   Have you had any family members/close contacts diagnosed with the COVID 19 since your procedure? no   If yes to any of these questions please route to Joylene John, RN and Joella Prince, RN

## 2020-11-22 DIAGNOSIS — Z1231 Encounter for screening mammogram for malignant neoplasm of breast: Secondary | ICD-10-CM | POA: Diagnosis not present

## 2020-11-22 LAB — HM MAMMOGRAPHY

## 2021-01-21 ENCOUNTER — Other Ambulatory Visit: Payer: Self-pay | Admitting: Family

## 2021-09-12 ENCOUNTER — Encounter: Payer: Self-pay | Admitting: Family

## 2021-09-12 ENCOUNTER — Ambulatory Visit (INDEPENDENT_AMBULATORY_CARE_PROVIDER_SITE_OTHER): Payer: Medicare Other | Admitting: Family

## 2021-09-12 VITALS — BP 141/77 | HR 85 | Temp 98.6°F | Resp 16 | Ht 62.0 in | Wt 146.0 lb

## 2021-09-12 DIAGNOSIS — E559 Vitamin D deficiency, unspecified: Secondary | ICD-10-CM | POA: Diagnosis not present

## 2021-09-12 DIAGNOSIS — M7022 Olecranon bursitis, left elbow: Secondary | ICD-10-CM

## 2021-09-12 DIAGNOSIS — M7021 Olecranon bursitis, right elbow: Secondary | ICD-10-CM | POA: Diagnosis not present

## 2021-09-12 DIAGNOSIS — E348 Other specified endocrine disorders: Secondary | ICD-10-CM

## 2021-09-12 DIAGNOSIS — I10 Essential (primary) hypertension: Secondary | ICD-10-CM

## 2021-09-12 DIAGNOSIS — Z0000 Encounter for general adult medical examination without abnormal findings: Secondary | ICD-10-CM

## 2021-09-12 DIAGNOSIS — Z23 Encounter for immunization: Secondary | ICD-10-CM | POA: Diagnosis not present

## 2021-09-12 DIAGNOSIS — E785 Hyperlipidemia, unspecified: Secondary | ICD-10-CM

## 2021-09-12 DIAGNOSIS — D582 Other hemoglobinopathies: Secondary | ICD-10-CM

## 2021-09-12 LAB — COMPREHENSIVE METABOLIC PANEL
ALT: 31 U/L (ref 0–35)
AST: 22 U/L (ref 0–37)
Albumin: 4.5 g/dL (ref 3.5–5.2)
Alkaline Phosphatase: 54 U/L (ref 39–117)
BUN: 13 mg/dL (ref 6–23)
CO2: 28 mEq/L (ref 19–32)
Calcium: 10.1 mg/dL (ref 8.4–10.5)
Chloride: 102 mEq/L (ref 96–112)
Creatinine, Ser: 0.78 mg/dL (ref 0.40–1.20)
GFR: 79.28 mL/min (ref >60.00)
Glucose, Bld: 86 mg/dL (ref 70–99)
Potassium: 3.9 mEq/L (ref 3.5–5.1)
Sodium: 139 mEq/L (ref 135–145)
Total Bilirubin: 0.6 mg/dL (ref 0.2–1.2)
Total Protein: 6.9 g/dL (ref 6.0–8.3)

## 2021-09-12 LAB — LIPID PANEL
Cholesterol: 224 mg/dL — ABNORMAL HIGH (ref 0–200)
HDL: 87.3 mg/dL (ref >39.00)
LDL Cholesterol: 112 mg/dL — ABNORMAL HIGH (ref 0–99)
NonHDL: 136.74
Total CHOL/HDL Ratio: 3
Triglycerides: 123 mg/dL (ref 0.0–149.0)
VLDL: 24.6 mg/dL (ref 0.0–40.0)

## 2021-09-12 LAB — CBC WITH DIFFERENTIAL/PLATELET
Basophils Absolute: 0.1 10*3/uL (ref 0.0–0.1)
Basophils Relative: 1.3 % (ref 0.0–3.0)
Eosinophils Absolute: 0.1 10*3/uL (ref 0.0–0.7)
Eosinophils Relative: 1.6 % (ref 0.0–5.0)
HCT: 45.7 % (ref 36.0–46.0)
Hemoglobin: 15.1 g/dL — ABNORMAL HIGH (ref 12.0–15.0)
Lymphocytes Relative: 29.3 % (ref 12.0–46.0)
Lymphs Abs: 1.6 10*3/uL (ref 0.7–4.0)
MCHC: 33 g/dL (ref 30.0–36.0)
MCV: 95.5 fl (ref 78.0–100.0)
Monocytes Absolute: 0.6 10*3/uL (ref 0.1–1.0)
Monocytes Relative: 10.4 % (ref 3.0–12.0)
Neutro Abs: 3.2 10*3/uL (ref 1.4–7.7)
Neutrophils Relative %: 57.4 % (ref 43.0–77.0)
Platelets: 306 10*3/uL (ref 150.0–400.0)
RBC: 4.79 Mil/uL (ref 3.87–5.11)
RDW: 14.2 % (ref 11.5–15.5)
WBC: 5.5 10*3/uL (ref 4.0–10.5)

## 2021-09-12 LAB — VITAMIN D 25 HYDROXY (VIT D DEFICIENCY, FRACTURES): VITD: 28.76 ng/mL — ABNORMAL LOW (ref 30.00–100.00)

## 2021-09-12 MED ORDER — TRIAMTERENE-HCTZ 37.5-25 MG PO CAPS: 1.0000 | ORAL_CAPSULE | Freq: Every day | ORAL | 1 refills | Status: DC

## 2021-09-12 NOTE — Patient Instructions (Signed)
Please complete lab work prior to leaving. ?Continue your work on healthy diet/exercise.  ?

## 2021-09-12 NOTE — Assessment & Plan Note (Addendum)
BP Readings from Last 3 Encounters:  ?09/12/21 (!) 141/77  ?09/10/20 133/67  ?08/17/20 127/76  ? ?BP acceptable for her age. Continue dyazide and Kdur. ?

## 2021-09-12 NOTE — Assessment & Plan Note (Signed)
Continue healthy diet, exercise. Pap up to date, mammo up to date, colo up to date. Prevnar 20 today.  Other immunizations reviewed and up to date.  ?

## 2021-09-12 NOTE — Assessment & Plan Note (Signed)
Not on statin, continue low fat diet.  ?

## 2021-09-12 NOTE — Assessment & Plan Note (Signed)
New. Refer to sports medicine. ?

## 2021-09-12 NOTE — Progress Notes (Addendum)
Subjective:   By signing my name below, I, Zite Okoli, attest that this documentation has been prepared under the direction and in the presence of [name], NP 09/12/2021       [name], female    DOB: 1955/10/24, 66 y.o.   MRN: [id number]  Chief Complaint  Patient presents with   Annual Exam         HPI Patient is in today for a comprehensive physical exam.  Elbow Pain- She reports she has been more achy these past few days. Adds that her elbows have been more tense and hurt on movement with right being worse than the left. She uses Advil to manage the pain. She has a family history of arthritis.  Reflux- She has noticed frequent indigestion with specific foods like onions and red wine. Notices it more when she is lying down. Colonoscopy- Last completed on 08/17/2020. Polyps found in the proximal sigmoid colon were resected and retrieved. There was minimal colonic diverticulosis and non-bleeding internal hemorrhoids. Otherwise exam was normal. Repeat in 3 years. Mammogram- Last checked on 11/22/2020. Results were normal. Repeat in 1 year. Dexa- Last checked on 01/14/2018. Patient was considered normal according to the [facility]. Repeat in 2 years. DUE Pap smear- Last checked on 06/03/2020. Results were normal. Repeat in 3-5 years. Immunizations- She has received the flu, shingles and pneumonia vaccines. She has 4 Covid-19 vaccines at this time. She will receive the pneumonia booster vaccine. Diet and Exercise- She is managing a healthy diet especially with her husband's restricted diet. She exercises by walking. Social History- She drinks alcohol occasionally. She does not smoke or use tobacco products.  Vision and Dental- She is UTD on dental and vision  She is requesting for a refill on 37.5-25 mg dyazide since her gynecologist retired.  No recent surgeries. No recent changes in family history.   She denies having any unexpected weight  change, ear pain, hearing loss and rhinorrhea, visual disturbance, cough, chest pain and leg swelling, nausea, vomiting, diarrhea and blood in stool, or dysuria and frequency, for myalgias and arthralgias, rash, headaches, adenopathy, depression or anxiety at this time   Past Medical History:  Diagnosis Date   Acute bronchitis 04/11/2015   GERD (gastroesophageal reflux disease)    PRN Tums    Hyperlipidemia 01/11/2015   Hypertension    Vitamin D deficiency 01/29/2015    Past Surgical History:  Procedure Laterality Date   ECTOPIC PREGNANCY SURGERY  1985    Family History  Problem Relation Age of Onset   Hypertension Father    Cancer Father        prostate   Hyperlipidemia Father    Osteoarthritis Father        died of covid-19 2019-09-01   Prostate cancer Father    Osteoarthritis Mother    Breast cancer Mother 90   Cancer Maternal Grandfather 52       colon   Colon polyps Maternal Grandfather    Esophageal cancer Neg Hx    Rectal cancer Neg Hx    Stomach cancer Neg Hx    Crohn's disease Neg Hx     Social History   Socioeconomic History   Marital status: Married    Spouse name: Not on file   Number of children: Not on file   Years of education: Not on file   Highest education level: Not on file  Occupational History   Not on file  Tobacco Use  Smoking status: Former    Types: Cigarettes    Quit date: 1988    Years since quitting: 35.2   Smokeless tobacco: Never  Substance and Sexual Activity   Alcohol use: Yes    Alcohol/week: 1.0 - 2.0 standard drink    Types: 1 - 2 Cans of beer per week    Comment: has 2 drinks on weekend   Drug use: No    Frequency: 1.0 times per week   Sexual activity: Yes    Partners: Male  Other Topics Concern   Not on file  Social History Narrative   Married   Works At [employer]   2 children   [place] (daughter)- has 2 children (lives in [place])   [place] ([place])- lives will   Grew up in [place]  Determinants of Radio broadcast assistant Strain: Not on [employer] Insecurity: Not on file  Transportation Needs: Not on file  Physical Activity: Not on file  Stress: Not on file  Social Connections: Not on file  Intimate Partner Violence: Not on file    Outpatient Medications Prior to Visit  Medication Sig Dispense Refill   Cholecalciferol (VITAMIN D3) 3000 UNITS TABS Take 1 tablet by mouth daily. 30 tablet    potassium chloride SA (KLOR-CON) 20 MEQ tablet TAKE 1 TABLET BY MOUTH  TWICE DAILY 180 tablet 3   triamterene-hydrochlorothiazide (DYAZIDE) 37.5-25 MG per capsule Take 1 capsule by mouth daily.     norethindrone-ethinyl estradiol (FYAVOLV) 0.5-2.5 MG-MCG tablet Take 1 tablet by mouth daily. 84 tablet 1   No facility-administered medications prior to visit.    No Known Allergies  Review of Systems  Constitutional:  Negative for fever.  HENT:  Negative for ear pain and hearing loss.        (-)adenopathy  Eyes:  Negative for blurred vision.  Respiratory:  Negative for cough, shortness of breath and wheezing.   Cardiovascular:  Negative for chest pain and leg swelling.  Gastrointestinal:  Negative for blood in stool, diarrhea, nausea and vomiting.       (+) indigestion  Genitourinary:  Negative for dysuria and frequency.  Musculoskeletal:  Positive for joint pain (bilateral elbows). Negative for myalgias.  Skin:  Negative for rash.  Neurological:  Negative for headaches.  Psychiatric/Behavioral:  Negative for depression. The patient is not nervous/anxious.       Objective:    Physical Exam Constitutional:      General: She is not in acute distress.    Appearance: Normal appearance. She is not ill-appearing.  HENT:     Head: Normocephalic and atraumatic.     Comments: Cerumen in left ear     Right Ear: External ear normal.     Left Ear: External ear normal.  Eyes:     Extraocular Movements: Extraocular movements intact.     Right eye: No nystagmus.     Left  eye: No nystagmus.     Pupils: Pupils are equal, round, and reactive to light.  Cardiovascular:     Rate and Rhythm: Normal rate and regular rhythm.     Pulses: Normal pulses.     Heart sounds: Normal heart sounds. No murmur heard. Pulmonary:     Effort: Pulmonary effort is normal. No respiratory distress.     Breath sounds: Normal breath sounds. No wheezing or rhonchi.  Abdominal:     General: Bowel sounds are normal. There is no distension.  Palpations: Abdomen is soft.     Tenderness: There is no abdominal tenderness. There is no guarding or rebound.  Musculoskeletal:     Cervical back: Neck supple.     Comments: 5/5 strength in upper and lower extremities  Lymphadenopathy:     Cervical: No cervical adenopathy.  Skin:    General: Skin is warm and dry.  Neurological:     Mental Status: She is alert and oriented to person, place, and time.     Deep Tendon Reflexes:     Reflex Scores:      Patellar reflexes are 2+ on the right side and 2+ on the left side. Psychiatric:        Behavior: Behavior normal.        Judgment: Judgment normal.    BP (!) 141/77 (BP Location: Right Arm, Patient Position: Sitting, Cuff Size: Small)    Pulse 85    Temp 98.6 F (37 C) (Oral)    Resp 16    Ht _0  (1.575 m)    Wt 146 lb (66.2 kg)    LMP 01/05/2005    SpO2 99%    BMI 26.70 kg/m  Wt Readings from Last 3 Encounters:  09/12/21 146 lb (66.2 kg)  09/10/20 143 lb (64.9 kg)  08/17/20 143 lb (64.9 kg)    Diabetic Foot Exam - Simple   No data filed    Lab Results  Component Value Date   WBC 7.6 09/10/2020   HGB 15.1 (H) 09/10/2020   HCT 44.8 09/10/2020   PLT 304.0 09/10/2020   GLUCOSE 83 09/10/2020   CHOL 233 (H) 09/10/2020   TRIG 126.0 09/10/2020   HDL 74.80 09/10/2020   LDLCALC 133 (H) 09/10/2020   ALT 15 09/10/2020   AST 16 09/10/2020   NA 139 09/10/2020   K 3.9 09/10/2020   CL 101 09/10/2020   CREATININE 0.73 09/10/2020   BUN 11 09/10/2020   CO2 27 09/10/2020   TSH 0.88  07/29/2019    Lab Results  Component Value Date   TSH 0.88 07/29/2019   Lab Results  Component Value Date   WBC 7.6 09/10/2020   HGB 15.1 (H) 09/10/2020   HCT 44.8 09/10/2020   MCV 93.1 09/10/2020   PLT 304.0 09/10/2020   Lab Results  Component Value Date   NA 139 09/10/2020   K 3.9 09/10/2020   CO2 27 09/10/2020   GLUCOSE 83 09/10/2020   BUN 11 09/10/2020   CREATININE 0.73 09/10/2020   BILITOT 0.5 09/10/2020   ALKPHOS 49 09/10/2020   AST 16 09/10/2020   ALT 15 09/10/2020   PROT 7.2 09/10/2020   ALBUMIN 4.4 09/10/2020   CALCIUM 9.9 09/10/2020   GFR 86.45 09/10/2020   Lab Results  Component Value Date   CHOL 233 (H) 09/10/2020   Lab Results  Component Value Date   HDL 74.80 09/10/2020   Lab Results  Component Value Date   LDLCALC 133 (H) 09/10/2020   Lab Results  Component Value Date   TRIG 126.0 09/10/2020   Lab Results  Component Value Date   CHOLHDL 3 09/10/2020   No results found for: HGBA1C     Assessment & Plan:   Problem List Items Addressed This Visit       Unprioritized   Vitamin D deficiency   Relevant Orders   VITAMIN D 25 Hydroxy (Vit-D Deficiency, Fractures)   Preventative health care - Primary    Continue healthy diet, exercise. Pap up to date,  mammo up to date, colo up to date. Prevnar 20 today.  Other immunizations reviewed and up to date.       Olecranon bursitis of both elbows    New. Refer to sports medicine.      Relevant Orders   Ambulatory referral to Sports Medicine   Hyperlipidemia    Not on statin, continue low fat diet.       Relevant Medications   triamterene-hydrochlorothiazide (DYAZIDE) 37.5-25 MG capsule   Other Relevant Orders   Comp Met (CMET)   Lipid panel   HTN (hypertension)    BP Readings from Last 3 Encounters:  09/12/21 (!) 141/77  09/10/20 133/67  08/17/20 127/76  BP acceptable for her age. Continue dyazide and Kdur.      Relevant Medications   triamterene-hydrochlorothiazide (DYAZIDE)  37.5-25 MG capsule   Other Visit Diagnoses     Estradiol deficiency       Relevant Orders   DG Bone Density   Elevated hemoglobin (HCC)       Relevant Orders   CBC with Differential/Platelet   Need for pneumococcal vaccine       Relevant Orders   Pneumococcal conjugate vaccine 20-valent (Prevnar 20) (Completed)       Meds ordered this encounter  Medications   triamterene-hydrochlorothiazide (DYAZIDE) 37.5-25 MG capsule    Sig: Take 1 each (1 capsule total) by mouth daily.    Dispense:  90 capsule    Refill:  1    Order Specific Question:   Supervising Provider    Answer:   Penni Homans A [[id number]]    I,Zite Okoli,acting as a scribe for Nance Pear, NP.,have documented all relevant documentation on the behalf of Nance Pear, NP,as directed by  Nance Pear, NP while in the presence of Nance Pear, NP.   I, [name], NP, personally preformed the services described in this documentation.  All medical record entries made by the scribe were at my direction and in my presence.  I have reviewed the chart and discharge instructions (if applicable) and agree that the record reflects my personal performance and is accurate and complete. 09/12/2021

## 2021-09-13 ENCOUNTER — Telehealth: Payer: Self-pay | Admitting: Family

## 2021-09-13 NOTE — Telephone Encounter (Signed)
See mychart.  

## 2021-09-20 ENCOUNTER — Ambulatory Visit: Payer: Self-pay

## 2021-09-20 ENCOUNTER — Encounter: Payer: Self-pay | Admitting: Family Medicine

## 2021-09-20 ENCOUNTER — Ambulatory Visit: Payer: Medicare Other | Admitting: Family Medicine

## 2021-09-20 VITALS — BP 118/72 | Ht 62.0 in | Wt 146.0 lb

## 2021-09-20 DIAGNOSIS — M25521 Pain in right elbow: Secondary | ICD-10-CM

## 2021-09-20 DIAGNOSIS — M7701 Medial epicondylitis, right elbow: Secondary | ICD-10-CM | POA: Diagnosis not present

## 2021-09-20 DIAGNOSIS — M7711 Lateral epicondylitis, right elbow: Secondary | ICD-10-CM | POA: Diagnosis not present

## 2021-09-20 NOTE — Progress Notes (Signed)
?  Jeanette Petty - 66 y.o. female MRN [id number]  Date of birth: Oct 12, 1955 ? ?SUBJECTIVE:  Including CC & ROS.  ?No chief complaint on file. ? ? ?Jeanette Petty is a 66 y.o. female that is presenting with right elbow pain.  The pain is occurring medially and laterally.  Denies any injury.  Has been ongoing for several months.  No injury or inciting event.  No history of surgery. ? ?Reviewed the note from 3/13 shows she was found to have bursitis. ? ? ?Review of Systems ?See HPI  ? ?HISTORY: Past Medical, Surgical, Social, and Family History Reviewed & Updated per EMR.   ?Pertinent Historical Findings include: ? ?Past Medical History:  ?Diagnosis Date  ? Acute bronchitis 04/11/2015  ? GERD (gastroesophageal reflux disease)   ? PRN Tums   ? Hyperlipidemia 01/11/2015  ? Hypertension   ? Vitamin D deficiency 01/29/2015  ? ? ?Past Surgical History:  ?Procedure Laterality Date  ? ECTOPIC PREGNANCY SURGERY  1985  ? ? ? ?PHYSICAL EXAM:  ?VS: BP 118/72 (BP Location: Left Arm, Patient Position: Sitting)   Ht '5\' 2"'$  (1.575 m)   Wt 146 lb (66.2 kg)   LMP 01/05/2005   BMI 26.70 kg/m?  ?Physical Exam ?Gen: NAD, alert, cooperative with exam, well-appearing ?MSK:  ?Neurovascularly intact   ? ?Limited ultrasound: Right elbow: ? ?No significant changes at the radial head or lateral condyle. ?No hyperemia or mucoid changes at the origin of the medial epicondyle. ? ?Summary: No significant structural changes. ? ?Ultrasound and interpretation by Clearance Coots, MD ? ? ? ?ASSESSMENT & PLAN:  ? ?Lateral epicondylitis of right elbow ?Acute on chronic in nature.  Clinically resembles tennis elbow. ?-Counseled on home exercise therapy and supportive care. ?-Could consider shockwave therapy or physical therapy. ? ?Medial epicondylitis of elbow, right ?Acute on chronic in nature.  No significant structural changes. ?-Counseled on home exercise therapy and supportive care. ?-Could consider shockwave therapy or nitro patches. ? ? ? ? ?

## 2021-09-20 NOTE — Assessment & Plan Note (Signed)
Acute on chronic in nature.  Clinically resembles tennis elbow. ?-Counseled on home exercise therapy and supportive care. ?-Could consider shockwave therapy or physical therapy. ?

## 2021-09-20 NOTE — Assessment & Plan Note (Signed)
Acute on chronic in nature.  No significant structural changes. ?-Counseled on home exercise therapy and supportive care. ?-Could consider shockwave therapy or nitro patches. ?

## 2021-09-20 NOTE — Patient Instructions (Signed)
Nice to meet you ?Please try ice  ?Please try the exercises   ?Please send me a message in MyChart with any questions or updates.  ?Please see me back in 4 weeks or as needed if better.  ? ?--Dr. Raeford Razor ? ?

## 2021-10-06 ENCOUNTER — Encounter: Payer: Self-pay | Admitting: Family

## 2021-10-06 ENCOUNTER — Ambulatory Visit (HOSPITAL_BASED_OUTPATIENT_CLINIC_OR_DEPARTMENT_OTHER)
Admission: RE | Admit: 2021-10-06 | Discharge: 2021-10-06 | Disposition: A | Discharge status: Home / Self Care | Payer: Medicare Other | Source: Ambulatory Visit | Attending: Family | Admitting: Family

## 2021-10-06 DIAGNOSIS — M85852 Other specified disorders of bone density and structure, left thigh: Secondary | ICD-10-CM | POA: Insufficient documentation

## 2021-10-06 DIAGNOSIS — L821 Other seborrheic keratosis: Secondary | ICD-10-CM | POA: Diagnosis not present

## 2021-10-06 DIAGNOSIS — Z780 Asymptomatic menopausal state: Secondary | ICD-10-CM | POA: Diagnosis not present

## 2021-10-06 DIAGNOSIS — E348 Other specified endocrine disorders: Secondary | ICD-10-CM | POA: Insufficient documentation

## 2021-10-06 DIAGNOSIS — L578 Other skin changes due to chronic exposure to nonionizing radiation: Secondary | ICD-10-CM | POA: Diagnosis not present

## 2021-10-06 DIAGNOSIS — D1801 Hemangioma of skin and subcutaneous tissue: Secondary | ICD-10-CM | POA: Diagnosis not present

## 2021-10-31 ENCOUNTER — Ambulatory Visit (INDEPENDENT_AMBULATORY_CARE_PROVIDER_SITE_OTHER): Payer: Medicare Other

## 2021-10-31 DIAGNOSIS — Z0000 Encounter for general adult medical examination without abnormal findings: Secondary | ICD-10-CM

## 2021-10-31 NOTE — Patient Instructions (Signed)
Jeanette Petty , ?Thank you for taking time to come for your Medicare Wellness Visit. I appreciate your ongoing commitment to your health goals. Please review the following plan we discussed and let me know if I can assist you in the future.  ? ?Screening recommendations/referrals: ?Colonoscopy: 08/17/20 due 08/18/23 ?Mammogram: 11/22/20 due 11/23/22 ?Bone Density: 10/06/21 due 10/07/23 ?Recommended yearly ophthalmology/optometry visit for glaucoma screening and checkup ?Recommended yearly dental visit for hygiene and checkup ? ?Vaccinations: ?Influenza vaccine: up to date ?Pneumococcal vaccine: up to date ?Tdap vaccine: up to date ?Shingles vaccine: up to date   ?Covid-19:completed  ? ?Advanced directives: yes, not on file ? ?Conditions/risks identified: see problem list  ? ?Next appointment: Follow up in one year for your annual wellness visit  ? ? ?Preventive Care 67 Years and Older, Female ?Preventive care refers to lifestyle choices and visits with your health care provider that can promote health and wellness. ?What does preventive care include? ?A yearly physical exam. This is also called an annual well check. ?Dental exams once or twice a year. ?Routine eye exams. Ask your health care provider how often you should have your eyes checked. ?Personal lifestyle choices, including: ?Daily care of your teeth and gums. ?Regular physical activity. ?Eating a healthy diet. ?Avoiding tobacco and drug use. ?Limiting alcohol use. ?Practicing safe sex. ?Taking low-dose aspirin every day. ?Taking vitamin and mineral supplements as recommended by your health care provider. ?What happens during an annual well check? ?The services and screenings done by your health care provider during your annual well check will depend on your age, overall health, lifestyle risk factors, and family history of disease. ?Counseling  ?Your health care provider may ask you questions about your: ?Alcohol use. ?Tobacco use. ?Drug use. ?Emotional  well-being. ?Home and relationship well-being. ?Sexual activity. ?Eating habits. ?History of falls. ?Memory and ability to understand (cognition). ?Work and work Statistician. ?Reproductive health. ?Screening  ?You may have the following tests or measurements: ?Height, weight, and BMI. ?Blood pressure. ?Lipid and cholesterol levels. These may be checked every 5 years, or more frequently if you are over 28 years old. ?Skin check. ?Lung cancer screening. You may have this screening every year starting at age 23 if you have a 30-pack-year history of smoking and currently smoke or have quit within the past 15 years. ?Fecal occult blood test (FOBT) of the stool. You may have this test every year starting at age 57. ?Flexible sigmoidoscopy or colonoscopy. You may have a sigmoidoscopy every 5 years or a colonoscopy every 10 years starting at age 43. ?Hepatitis C blood test. ?Hepatitis B blood test. ?Sexually transmitted disease (STD) testing. ?Diabetes screening. This is done by checking your blood sugar (glucose) after you have not eaten for a while (fasting). You may have this done every 1-3 years. ?Bone density scan. This is done to screen for osteoporosis. You may have this done starting at age 104. ?Mammogram. This may be done every 1-2 years. Talk to your health care provider about how often you should have regular mammograms. ?Talk with your health care provider about your test results, treatment options, and if necessary, the need for more tests. ?Vaccines  ?Your health care provider may recommend certain vaccines, such as: ?Influenza vaccine. This is recommended every year. ?Tetanus, diphtheria, and acellular pertussis (Tdap, Td) vaccine. You may need a Td booster every 10 years. ?Zoster vaccine. You may need this after age 67. ?Pneumococcal 13-valent conjugate (PCV13) vaccine. One dose is recommended after age 44. ?Pneumococcal polysaccharide (  PPSV23) vaccine. One dose is recommended after age 25. ?Talk to your  health care provider about which screenings and vaccines you need and how often you need them. ?This information is not intended to replace advice given to you by your health care provider. Make sure you discuss any questions you have with your health care provider. ?Document Released: 07/16/2015 Document Revised: 03/08/2016 Document Reviewed: 04/20/2015 ?Elsevier Interactive Patient Education ? 2017 [place]. ? ?Fall Prevention in the Home ?Falls can cause injuries. They can happen to people of all ages. There are many things you can do to make your home safe and to help prevent falls. ?What can I do on the outside of my home? ?Regularly fix the edges of walkways and driveways and fix any cracks. ?Remove anything that might make you trip as you walk through a door, such as a raised step or threshold. ?Trim any bushes or trees on the path to your home. ?Use bright outdoor lighting. ?Clear any walking paths of anything that might make someone trip, such as rocks or tools. ?Regularly check to see if handrails are loose or broken. Make sure that both sides of any steps have handrails. ?Any raised decks and porches should have guardrails on the edges. ?Have any leaves, snow, or ice cleared regularly. ?Use sand or salt on walking paths during winter. ?Clean up any spills in your garage right away. This includes oil or grease spills. ?What can I do in the bathroom? ?Use night lights. ?Install grab bars by the toilet and in the tub and shower. Do not use towel bars as grab bars. ?Use non-skid mats or decals in the tub or shower. ?If you need to sit down in the shower, use a plastic, non-slip stool. ?Keep the floor dry. Clean up any water that spills on the floor as soon as it happens. ?Remove soap buildup in the tub or shower regularly. ?Attach bath mats securely with double-sided non-slip rug tape. ?Do not have throw rugs and other things on the floor that can make you trip. ?What can I do in the bedroom? ?Use night  lights. ?Make sure that you have a light by your bed that is easy to reach. ?Do not use any sheets or blankets that are too big for your bed. They should not hang down onto the floor. ?Have a firm chair that has side arms. You can use this for support while you get dressed. ?Do not have throw rugs and other things on the floor that can make you trip. ?What can I do in the kitchen? ?Clean up any spills right away. ?Avoid walking on wet floors. ?Keep items that you use a lot in easy-to-reach places. ?If you need to reach something above you, use a strong step stool that has a grab bar. ?Keep electrical cords out of the way. ?Do not use floor polish or wax that makes floors slippery. If you must use wax, use non-skid floor wax. ?Do not have throw rugs and other things on the floor that can make you trip. ?What can I do with my stairs? ?Do not leave any items on the stairs. ?Make sure that there are handrails on both sides of the stairs and use them. Fix handrails that are broken or loose. Make sure that handrails are as long as the stairways. ?Check any carpeting to make sure that it is firmly attached to the stairs. Fix any carpet that is loose or worn. ?Avoid having throw rugs at the top or  bottom of the stairs. If you do have throw rugs, attach them to the floor with carpet tape. ?Make sure that you have a light switch at the top of the stairs and the bottom of the stairs. If you do not have them, ask someone to add them for you. ?What else can I do to help prevent falls? ?Wear shoes that: ?Do not have high heels. ?Have rubber bottoms. ?Are comfortable and fit you well. ?Are closed at the toe. Do not wear sandals. ?If you use a stepladder: ?Make sure that it is fully opened. Do not climb a closed stepladder. ?Make sure that both sides of the stepladder are locked into place. ?Ask someone to hold it for you, if possible. ?Clearly mark and make sure that you can see: ?Any grab bars or handrails. ?First and last  steps. ?Where the edge of each step is. ?Use tools that help you move around (mobility aids) if they are needed. These include: ?Canes. ?Walkers. ?Scooters. ?Crutches. ?Turn on the lights when you go into a dark area. Repl

## 2021-10-31 NOTE — Progress Notes (Signed)
Subjective:   Jeanette Petty is a 66 y.o. female who presents for an Initial Medicare Annual Wellness Visit.  I connected with  [name] on 10/31/21 by a audio enabled telemedicine application and verified that I am speaking with the correct person using two identifiers.  Patient Location: Home  Provider Location: Office/Clinic  I discussed the limitations of evaluation and management by telemedicine. The patient expressed understanding and agreed to proceed.   Review of Systems    Cardiac Risk Factors include: advanced age (>12men, >4 women);hypertension;dyslipidemia     Objective:    There were no vitals filed for this visit. There is no height or weight on file to calculate BMI.     10/31/2021   10:22 AM  Advanced Directives  Does Patient Have a Medical Advance Directive? Yes  Type of Estate agent of [place];Out of facility DNR (pink MOST or yellow form);Living will  Copy of Healthcare Power of Attorney in Chart? No - copy requested    Current Medications (verified) Outpatient Encounter Medications as of 10/31/2021  Medication Sig   Cholecalciferol (VITAMIN D3) 3000 UNITS TABS Take 1 tablet by mouth daily.   potassium chloride SA (KLOR-CON) 20 MEQ tablet TAKE 1 TABLET BY MOUTH  TWICE DAILY   triamterene-hydrochlorothiazide (DYAZIDE) 37.5-25 MG capsule Take 1 each (1 capsule total) by mouth daily.   No facility-administered encounter medications on file as of 10/31/2021.    Allergies (verified) Patient has no known allergies.   History: Past Medical History:  Diagnosis Date   Acute bronchitis 04/11/2015   GERD (gastroesophageal reflux disease)    PRN Tums    Hyperlipidemia 01/11/2015   Hypertension    Osteopenia    Vitamin D deficiency 01/29/2015   Past Surgical History:  Procedure Laterality Date   ECTOPIC PREGNANCY SURGERY  1985   Family History  Problem Relation Age of Onset   Hypertension Father    Cancer Father         prostate   Hyperlipidemia Father    Osteoarthritis Father        died of covid-19 07/26/19   Prostate cancer Father    Osteoarthritis Mother    Breast cancer Mother 37   Cancer Maternal Grandfather 31       colon   Colon polyps Maternal Grandfather    Esophageal cancer Neg Hx    Rectal cancer Neg Hx    Stomach cancer Neg Hx    Crohn's disease Neg Hx    Social History   Socioeconomic History   Marital status: Married    Spouse name: Not on file   Number of children: Not on file   Years of education: Not on file   Highest education level: Not on file  Occupational History   Not on file  Tobacco Use   Smoking status: Former    Types: Cigarettes    Quit date: 1988    Years since quitting: 35.3   Smokeless tobacco: Never  Substance and Sexual Activity   Alcohol use: Yes    Alcohol/week: 1.0 - 2.0 standard drink    Types: 1 - 2 Cans of beer per week    Comment: has 2 drinks on weekend   Drug use: No    Frequency: 1.0 times per week   Sexual activity: Yes    Partners: Male  Other Topics Concern   Not on file  Social History Narrative   Married   Works At [employer]   2  children   96- Dylan (daughter)- has 2 children (lives in [place])   1991- Tobi Bastos (1991)- lives will   Grew up in [place]   Social Determinants of Health   Financial Resource Strain: Low Risk    Difficulty of Paying Living Expenses: Not hard at all  Food Insecurity: No Food Insecurity   Worried About Programme researcher, broadcasting/film/video in the Last Year: Never true   Barista in the Last Year: Never true  Transportation Needs: No Transportation Needs   Lack of Transportation (Medical): No   Lack of Transportation (Non-Medical): No  Physical Activity: Sufficiently Active   Days of Exercise per Week: 7 days   Minutes of Exercise per Session: 40 min  Stress: No Stress Concern Present   Feeling of Stress : Not at all  Social Connections: Socially Integrated   Frequency of Communication with Friends  and Family: More than three times a week   Frequency of Social Gatherings with Friends and Family: More than three times a week   Attends Religious Services: More than 4 times per year   Active Member of [employer] or Organizations: Yes   Attends Engineer, structural: More than 4 times per year   Marital Status: Married    Tobacco Counseling Counseling given: Not Answered   Clinical Intake:  Pre-visit preparation completed: Yes  Pain : No/denies pain     Nutritional Risks: None Diabetes: No  How often do you need to have someone help you when you read instructions, pamphlets, or other written materials from your doctor or pharmacy?: 1 - Never  Diabetic?No  Interpreter Needed?: No  Information entered by :: Decklyn Hornik   Activities of Daily Living    10/31/2021   10:25 AM  In your present state of health, do you have any difficulty performing the following activities:  Hearing? 0  Vision? 0  Difficulty concentrating or making decisions? 0  Walking or climbing stairs? 0  Dressing or bathing? 0  Doing errands, shopping? 0  Preparing Food and eating ? N  Using the Toilet? N  In the past six months, have you accidently leaked urine? N  Do you have problems with loss of bowel control? N  Managing your Medications? N  Managing your Finances? N  Housekeeping or managing your Housekeeping? N    Patient Care Team: Sandford Craze, NP as PCP - General (Internal Medicine) Malena Peer, MD (Obstetrics and Gynecology)  Indicate any recent Medical Services you may have received from other than [facility] providers in the past year (date may be approximate).     Assessment:   This is a routine wellness examination for Jeanette Petty.  Hearing/Vision screen No results found.  Dietary issues and exercise activities discussed: Current Exercise Habits: Home exercise routine, Type of exercise: walking, Time (Minutes): 45, Frequency (Times/Week): 7, Weekly Exercise  (Minutes/Week): 315, Intensity: Mild, Exercise limited by: None identified   Goals Addressed   None    Depression Screen    10/31/2021   10:23 AM 09/12/2021    8:21 AM 09/10/2020   11:02 AM 07/29/2019    9:44 AM 12/20/2017    3:44 PM 11/10/2016    1:52 PM  PHQ 2/9 Scores  PHQ - 2 Score 0 0 0 0 0 0  PHQ- 9 Score     1 2    Fall Risk    10/31/2021   10:23 AM 09/12/2021    8:22 AM  Fall Risk   Falls in  the past year? 0 0  Number falls in past yr: 0 0  Injury with Fall? 0 0  Risk for fall due to : No Fall Risks   Follow up Falls evaluation completed     FALL RISK PREVENTION PERTAINING TO THE HOME:  Any stairs in or around the home? Yes  If so, are there any without handrails? No  Home free of loose throw rugs in walkways, pet beds, electrical cords, etc? Yes  Adequate lighting in your home to reduce risk of falls? Yes   ASSISTIVE DEVICES UTILIZED TO PREVENT FALLS:  Life alert? No  Use of a cane, walker or w/c? No  Grab bars in the bathroom? No  Shower chair or bench in shower? No  Elevated toilet seat or a handicapped toilet? No   TIMED UP AND GO:  Was the test performed? No .     Cognitive Function:        10/31/2021   10:26 AM  6CIT Screen  What Year? 0 points  What month? 0 points  What time? 0 points  Count back from 20 0 points  Months in reverse 0 points  Repeat phrase 0 points  Total Score 0 points    Immunizations Immunization History  Administered Date(s) Administered   Influenza, High Dose Seasonal PF 03/03/2021   Influenza,inj,Quad PF,6+ Mos 04/21/2018   Influenza,inj,Quad PF,6-35 Mos 04/03/2019   PFIZER(Purple Top)SARS-COV-2 Vaccination 09/03/2019, 10/01/2019, 04/30/2020   PNEUMOCOCCAL CONJUGATE-20 09/12/2021   Pfizer Covid-19 Vaccine Bivalent Booster 54yrs & up 04/15/2021   Pneumococcal Polysaccharide-23 09/10/2020   Tdap 01/06/2015   Zoster Recombinat (Shingrix) 07/29/2019, 10/21/2019    TDAP status: Up to date  Flu Vaccine status: Up  to date  Pneumococcal vaccine status: Up to date  Covid-19 vaccine status: Completed vaccines  Qualifies for Shingles Vaccine? Yes   Zostavax completed No   Shingrix Completed?: Yes  Screening Tests Health Maintenance  Topic Date Due   Hepatitis C Screening  Never done   INFLUENZA VACCINE  01/31/2022   MAMMOGRAM  11/23/2022   COLONOSCOPY (Pts 45-31yrs Insurance coverage will need to be confirmed)  08/18/2023   TETANUS/TDAP  01/05/2025   Pneumonia Vaccine 38+ Years old  Completed   DEXA SCAN  Completed   COVID-19 Vaccine  Completed   Zoster Vaccines- Shingrix  Completed   HPV VACCINES  Aged Out    Health Maintenance  Health Maintenance Due  Topic Date Due   Hepatitis C Screening  Never done    Colorectal cancer screening: Type of screening: Colonoscopy. Completed 08/17/20. Repeat every 3 years  Mammogram status: Completed 11/22/20. Repeat every year  Bone Density status: Completed 10/06/21. Results reflect: Bone density results: OSTEOPOROSIS. Repeat every 2 years.  Lung Cancer Screening: (Low Dose CT Chest recommended if Age 19-80 years, 30 pack-year currently smoking OR have quit w/in 15years.) does not qualify.   Lung Cancer Screening Referral: N/A  Additional Screening:  Hepatitis C Screening: does qualify; Completed not completed  Vision Screening: Recommended annual ophthalmology exams for early detection of glaucoma and other disorders of the eye. Is the patient up to date with their annual eye exam?  Yes  Who is the provider or what is the name of the office in which the patient attends annual eye exams? [facility] Dr[facility] Vickey Sages If pt is not established with a provider, would they like to be referred to a provider to establish care? No .   Dental Screening: Recommended annual dental exams for proper  oral hygiene  Community Resource Referral / Chronic Care Management: CRR required this visit?  No   CCM required this visit?  No      Plan:     I have  personally reviewed and noted the following in the patient's chart:   Medical and social history Use of alcohol, tobacco or illicit drugs  Current medications and supplements including opioid prescriptions. Patient is not currently taking opioid prescriptions. Functional ability and status Nutritional status Physical activity Advanced directives List of other physicians Hospitalizations, surgeries, and ER visits in previous 12 months Vitals Screenings to include cognitive, depression, and falls Referrals and appointments  In addition, I have reviewed and discussed with patient certain preventive protocols, quality metrics, and best practice recommendations. A written personalized care plan for preventive services as well as general preventive health recommendations were provided to patient.   Due to this being a telephonic visit, the after visit summary with patients personalized plan was offered to patient via mail or my-chart.  Patient would like to access on my-chart.   Salomon Mast [place], CMA   10/31/2021   Nurse Notes: none

## 2021-11-16 ENCOUNTER — Other Ambulatory Visit: Payer: Self-pay | Admitting: Family

## 2021-11-24 DIAGNOSIS — Z1231 Encounter for screening mammogram for malignant neoplasm of breast: Secondary | ICD-10-CM | POA: Diagnosis not present

## 2021-11-24 LAB — HM MAMMOGRAPHY

## 2022-01-01 ENCOUNTER — Other Ambulatory Visit: Payer: Self-pay | Admitting: Family

## 2022-03-02 DIAGNOSIS — K056 Periodontal disease, unspecified: Secondary | ICD-10-CM | POA: Diagnosis not present

## 2022-03-02 DIAGNOSIS — K0889 Other specified disorders of teeth and supporting structures: Secondary | ICD-10-CM | POA: Diagnosis not present

## 2022-03-02 DIAGNOSIS — K069 Disorder of gingiva and edentulous alveolar ridge, unspecified: Secondary | ICD-10-CM | POA: Diagnosis not present

## 2022-04-06 ENCOUNTER — Telehealth: Payer: Self-pay | Admitting: Family

## 2022-04-07 NOTE — Telephone Encounter (Signed)
Unable to lvm, mychart msg sent.

## 2022-04-07 NOTE — Telephone Encounter (Signed)
Please contact pt to schedule routine follow up visit.

## 2022-04-14 ENCOUNTER — Ambulatory Visit (INDEPENDENT_AMBULATORY_CARE_PROVIDER_SITE_OTHER): Payer: Medicare Other | Admitting: Family

## 2022-04-14 VITALS — BP 139/84 | HR 80 | Temp 98.2°F | Resp 16 | Wt 143.0 lb

## 2022-04-14 DIAGNOSIS — E876 Hypokalemia: Secondary | ICD-10-CM

## 2022-04-14 DIAGNOSIS — E785 Hyperlipidemia, unspecified: Secondary | ICD-10-CM

## 2022-04-14 DIAGNOSIS — E559 Vitamin D deficiency, unspecified: Secondary | ICD-10-CM | POA: Diagnosis not present

## 2022-04-14 DIAGNOSIS — I10 Essential (primary) hypertension: Secondary | ICD-10-CM | POA: Diagnosis not present

## 2022-04-14 LAB — BASIC METABOLIC PANEL
BUN: 14 mg/dL (ref 6–23)
CO2: 30 mEq/L (ref 19–32)
Calcium: 10.9 mg/dL — ABNORMAL HIGH (ref 8.4–10.5)
Chloride: 100 mEq/L (ref 96–112)
Creatinine, Ser: 0.79 mg/dL (ref 0.40–1.20)
GFR: 77.76 mL/min (ref >60.00)
Glucose, Bld: 91 mg/dL (ref 70–99)
Potassium: 4.4 mEq/L (ref 3.5–5.1)
Sodium: 139 mEq/L (ref 135–145)

## 2022-04-14 LAB — VITAMIN D 25 HYDROXY (VIT D DEFICIENCY, FRACTURES): VITD: 53.25 ng/mL (ref 30.00–100.00)

## 2022-04-14 MED ORDER — CALTRATE 600+D PLUS MINERALS 600-800 MG-UNIT PO TABS: 1.0000 | ORAL_TABLET | Freq: Every day | ORAL | Status: DC

## 2022-04-14 NOTE — Progress Notes (Signed)
Subjective:     [name], female    DOB: 09-Jan-1956, 66 y.o.   MRN: [id number]  Chief Complaint  Patient presents with   Hypertension    Here for follow up    Hypertension   Patient is in today for follow up.   She has not complaints today.   Health Maintenance Due  Topic Date Due   COVID-19 Vaccine (5 - Pfizer series) 08/16/2021    Past Medical History:  Diagnosis Date   Acute bronchitis 04/11/2015   GERD (gastroesophageal reflux disease)    PRN Tums    Hyperlipidemia 01/11/2015   Hypertension    Osteopenia    Vitamin D deficiency 01/29/2015    Past Surgical History:  Procedure Laterality Date   ECTOPIC PREGNANCY SURGERY  1985    Family History  Problem Relation Age of Onset   Hypertension Father    Cancer Father        prostate   Hyperlipidemia Father    Osteoarthritis Father        died of covid-19 09/15/2019   Prostate cancer Father    Osteoarthritis Mother    Breast cancer Mother 62   Cancer Maternal Grandfather 40       colon   Colon polyps Maternal Grandfather    Esophageal cancer Neg Hx    Rectal cancer Neg Hx    Stomach cancer Neg Hx    Crohn's disease Neg Hx     Social History   Socioeconomic History   Marital status: Married    Spouse name: Not on file   Number of children: Not on file   Years of education: Not on file   Highest education level: Not on file  Occupational History   Not on file  Tobacco Use   Smoking status: Former    Types: Cigarettes    Quit date: 1988    Years since quitting: 35.8   Smokeless tobacco: Never  Substance and Sexual Activity   Alcohol use: Yes    Alcohol/week: 1.0 - 2.0 standard drink of alcohol    Types: 1 - 2 Cans of beer per week    Comment: has 2 drinks on weekend   Drug use: No    Frequency: 1.0 times per week   Sexual activity: Yes    Partners: Male  Other Topics Concern   Not on file  Social History Narrative   Married   Works At [employer]   2 children    [place] (daughter)- has 2 children (lives in [place])   [place] 1989-09-14)- lives will   Grew up in [place] Strain: [place]  (10/31/2021)   Overall Financial Resource Strain (CARDIA)    Difficulty of Paying Living Expenses: Not hard at all  Food Insecurity: No Food Insecurity (10/31/2021)   Hunger Vital Sign    Worried About Running Out of Food in the Last Year: Never true    [place] in the Last Year: Never true  Transportation Needs: No Transportation Needs (10/31/2021)   PRAPARE - Hydrologist (Medical): No    Lack of Transportation (Non-Medical): No  Physical Activity: Sufficiently Active (10/31/2021)   Exercise Vital Sign    Days of Exercise per Week: 7 days    Minutes of Exercise per Session: 40 min  Stress: No Stress Concern Present (10/31/2021)   [employer] of Occupational  Health - Occupational Stress Questionnaire    Feeling of Stress : Not at all  Social Connections: Socially Integrated (10/31/2021)   Social Connection and Isolation Panel [NHANES]    Frequency of Communication with Friends and Family: More than three times a week    Frequency of Social Gatherings with Friends and Family: More than three times a week    Attends Religious Services: More than 4 times per year    Active Member of [employer] or Organizations: Yes    Attends Music therapist: More than 4 times per year    Marital Status: Married  Human resources officer Violence: Not At Risk (10/31/2021)   Humiliation, Afraid, Rape, and Kick questionnaire    Fear of Current or Ex-Partner: No    Emotionally Abused: No    Physically Abused: No    Sexually Abused: No    Outpatient Medications Prior to Visit  Medication Sig Dispense Refill   Cholecalciferol (VITAMIN D3) 3000 UNITS TABS Take 1 tablet by mouth daily. 30 tablet    potassium chloride SA (KLOR-CON M) 20 MEQ tablet TAKE 1 TABLET BY MOUTH TWICE  DAILY 200  tablet 1   triamterene-hydrochlorothiazide (DYAZIDE) 37.5-25 MG capsule TAKE 1 CAPSULE BY MOUTH ONCE  DAILY 100 capsule 2   No facility-administered medications prior to visit.    No Known Allergies  ROS See HPI    Objective:    Physical Exam Constitutional:      General: She is not in acute distress.    Appearance: Normal appearance. She is well-developed.  HENT:     Head: Normocephalic and atraumatic.     Right Ear: External ear normal.     Left Ear: External ear normal.  Eyes:     General: No scleral icterus. Neck:     Thyroid: No thyromegaly.  Cardiovascular:     Rate and Rhythm: Normal rate and regular rhythm.     Heart sounds: Normal heart sounds. No murmur heard. Pulmonary:     Effort: Pulmonary effort is normal. No respiratory distress.     Breath sounds: Normal breath sounds. No wheezing.  Musculoskeletal:     Cervical back: Neck supple.  Skin:    General: Skin is warm and dry.  Neurological:     Mental Status: She is alert and oriented to person, place, and time.  Psychiatric:        Mood and Affect: Mood normal.        Behavior: Behavior normal.        Thought Content: Thought content normal.        Judgment: Judgment normal.     BP 139/84 (BP Location: Right Arm, Patient Position: Sitting, Cuff Size: Small)   Pulse 80   Temp 98.2 F (36.8 C) (Oral)   Resp 16   Wt 143 lb (64.9 kg)   LMP 01/05/2005   SpO2 97%   BMI 26.16 kg/m  Wt Readings from Last 3 Encounters:  04/14/22 143 lb (64.9 kg)  09/20/21 146 lb (66.2 kg)  09/12/21 146 lb (66.2 kg)       Assessment & Plan:   Problem List Items Addressed This Visit       Unprioritized   Vitamin D deficiency - Primary    Continues otc vit D 3000 iu daily.       Relevant Orders   Vitamin D (25 hydroxy)   Hypokalemia    On Kdur. Repeat potassium.       Relevant Orders   Basic  metabolic panel   Hyperlipidemia    Cholesterol consistently mildly elevated. Continue low cholesterol diet.        HTN (hypertension)    BP Readings from Last 3 Encounters:  04/14/22 139/84  09/20/21 118/72  09/12/21 (!) 141/77  Continues dyazide. BP at goal.       Relevant Orders   Basic metabolic panel    I am having Eliany Detjen start on Caltrate 600+D Plus Minerals. I am also having her maintain her Vitamin D3, triamterene-hydrochlorothiazide, and potassium chloride SA.  Meds ordered this encounter  Medications   Calcium Carbonate-Vit D-Min (CALTRATE 600+D PLUS MINERALS) 600-800 MG-UNIT TABS    Sig: Take 1 tablet by mouth daily.    Order Specific Question:   Supervising Provider    Answer:   Penni Homans A [[id number]]

## 2022-04-14 NOTE — Assessment & Plan Note (Signed)
On Kdur. Repeat potassium.

## 2022-04-14 NOTE — Assessment & Plan Note (Addendum)
BP Readings from Last 3 Encounters:  04/14/22 139/84  09/20/21 118/72  09/12/21 (!) 141/77   Continues dyazide. BP at goal.

## 2022-04-14 NOTE — Assessment & Plan Note (Signed)
Cholesterol consistently mildly elevated. Continue low cholesterol diet.

## 2022-04-14 NOTE — Assessment & Plan Note (Signed)
Continues otc vit D 3000 iu daily.

## 2022-04-16 ENCOUNTER — Telehealth: Payer: Self-pay | Admitting: Family

## 2022-04-16 NOTE — Telephone Encounter (Signed)
Calcium level in blood is a little high. Please stop calcium supplement and and repeat labs as ordered in 2 weeks.

## 2022-04-17 NOTE — Telephone Encounter (Signed)
Patient advised of results. She will discontinue calcium and she will call for follow up

## 2022-08-11 DIAGNOSIS — K0889 Other specified disorders of teeth and supporting structures: Secondary | ICD-10-CM | POA: Diagnosis not present

## 2022-08-11 DIAGNOSIS — K069 Disorder of gingiva and edentulous alveolar ridge, unspecified: Secondary | ICD-10-CM | POA: Diagnosis not present

## 2022-08-11 DIAGNOSIS — K056 Periodontal disease, unspecified: Secondary | ICD-10-CM | POA: Diagnosis not present

## 2022-08-28 ENCOUNTER — Other Ambulatory Visit: Payer: Self-pay | Admitting: Family

## 2022-09-26 ENCOUNTER — Encounter: Payer: Self-pay | Admitting: Family

## 2022-09-26 ENCOUNTER — Ambulatory Visit (INDEPENDENT_AMBULATORY_CARE_PROVIDER_SITE_OTHER): Payer: Medicare Other | Admitting: Family

## 2022-09-26 VITALS — BP 135/69 | HR 73 | Temp 98.4°F | Resp 16 | Ht 62.0 in | Wt 143.0 lb

## 2022-09-26 DIAGNOSIS — E876 Hypokalemia: Secondary | ICD-10-CM

## 2022-09-26 DIAGNOSIS — M8580 Other specified disorders of bone density and structure, unspecified site: Secondary | ICD-10-CM | POA: Diagnosis not present

## 2022-09-26 DIAGNOSIS — Z0000 Encounter for general adult medical examination without abnormal findings: Secondary | ICD-10-CM | POA: Diagnosis not present

## 2022-09-26 DIAGNOSIS — E785 Hyperlipidemia, unspecified: Secondary | ICD-10-CM

## 2022-09-26 DIAGNOSIS — I10 Essential (primary) hypertension: Secondary | ICD-10-CM | POA: Diagnosis not present

## 2022-09-26 DIAGNOSIS — F419 Anxiety disorder, unspecified: Secondary | ICD-10-CM

## 2022-09-26 LAB — LIPID PANEL
Cholesterol: 268 mg/dL — ABNORMAL HIGH (ref 0–200)
HDL: 96.9 mg/dL (ref >39.00)
LDL Cholesterol: 137 mg/dL — ABNORMAL HIGH (ref 0–99)
NonHDL: 171.07
Total CHOL/HDL Ratio: 3
Triglycerides: 171 mg/dL — ABNORMAL HIGH (ref 0.0–149.0)
VLDL: 34.2 mg/dL (ref 0.0–40.0)

## 2022-09-26 LAB — COMPREHENSIVE METABOLIC PANEL
ALT: 25 U/L (ref 0–35)
AST: 20 U/L (ref 0–37)
Albumin: 4.8 g/dL (ref 3.5–5.2)
Alkaline Phosphatase: 69 U/L (ref 39–117)
BUN: 14 mg/dL (ref 6–23)
CO2: 31 mEq/L (ref 19–32)
Calcium: 10.5 mg/dL (ref 8.4–10.5)
Chloride: 98 mEq/L (ref 96–112)
Creatinine, Ser: 0.79 mg/dL (ref 0.40–1.20)
GFR: 77.51 mL/min (ref >60.00)
Glucose, Bld: 85 mg/dL (ref 70–99)
Potassium: 4.3 mEq/L (ref 3.5–5.1)
Sodium: 138 mEq/L (ref 135–145)
Total Bilirubin: 0.7 mg/dL (ref 0.2–1.2)
Total Protein: 7.7 g/dL (ref 6.0–8.3)

## 2022-09-26 MED ORDER — VALSARTAN 80 MG PO TABS: 80.0000 mg | ORAL_TABLET | Freq: Every day | ORAL | 0 refills | Status: DC

## 2022-09-26 MED ORDER — ESCITALOPRAM OXALATE 5 MG PO TABS: 5.0000 mg | ORAL_TABLET | Freq: Every day | ORAL | 2 refills | Status: DC

## 2022-09-26 NOTE — Assessment & Plan Note (Addendum)
Continue healthy diet and regular exercise.  Would like one more pap on another day.  Mammo and colo up to date. Immunizations reviewed and up to date.

## 2022-09-26 NOTE — Assessment & Plan Note (Signed)
Not taking calcium supplement due to hypercalcemia.

## 2022-09-26 NOTE — Assessment & Plan Note (Signed)
Lab Results  Component Value Date   CHOL 224 (H) 09/12/2021   HDL 87.30 09/12/2021   LDLCALC 112 (H) 09/12/2021   TRIG 123.0 09/12/2021   CHOLHDL 3 09/12/2021   Wt Readings from Last 3 Encounters:  09/26/22 143 lb (64.9 kg)  04/14/22 143 lb (64.9 kg)  09/20/21 146 lb (66.2 kg)

## 2022-09-26 NOTE — Assessment & Plan Note (Addendum)
This is causing issues with sleep.  Recommend trial of lexapro 5 mg.

## 2022-09-26 NOTE — Progress Notes (Signed)
Subjective:   By signing my name below, I, Jeanette Petty, attest that this documentation has been prepared under the direction and in the presence of Jeanette Alar, NP.  09/26/2022.   [name], female    DOB: Jun 23, 1956, 67 y.o.   MRN: [id number]  Chief Complaint  Patient presents with   Annual Exam    HPI Patient is in today for a comprehensive physical exam.  Insomnia/Stress:  Every so often she has periods of worsening insomnia usually triggered by life events/stressors. Lately she has noticed increased anxiety and worrying at night, which has exacerbated her insomnia. She has tried multiple options including ear plugs and white noise with limited success. In addition to her anxiety, she is also dealing with life stressors including being a caretaker for her mother, worrying about her daughter going through the IVF process, and now her husband is on a CPAP which has not necessarily improved his snoring.  Blood pressure:  Her blood pressure is stable in clinic today. She complains of urinary frequency due to her triamterene-HCTZ. At home her husband does have a blood pressure cuff; she would be amenable to starting home monitoring. BP Readings from Last 3 Encounters:  09/26/22 135/69  04/14/22 139/84  09/20/21 118/72   Joint pain:  She complains of occasional elbow pain bilaterally that has not worsened or improved. She suspects this may be arthritis.   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  Colonoscopy:  Last completed 08/17/2020.  Dexa:  Last completed 10/06/2021.   Pap Smear:  Last completed 06/03/2020 (negative). She denies any prior abnormal pap smears. Previously followed with Dr. Sharlet Salina. She will schedule a future pap smear.  Mammogram:  Last completed 11/24/2021.  Immunizations:  Influenza vaccine last received 03/17/2022.  Covid-19 vaccine last received 04/15/2021. She received an updated  Covid-19 vaccine 08/2022. Tdap last received 01/06/2015.  She has received the Shingrix and pneumonia vaccinations.  Diet:  Consists of lots of salads and vegetables. Lately she has not been taking a calcium supplement. She does take a potassium supplement.  Exercise:  She is considering purchasing a treadmill for home use as her husband has difficulty walking. Her weight has been stable. Wt Readings from Last 3 Encounters:  09/26/22 143 lb (64.9 kg)  04/14/22 143 lb (64.9 kg)  09/20/21 146 lb (66.2 kg)   Dental/Vision:  She reports that she is up to date.  Denies having any fever, new muscle pain, new moles, congestion, sinus pain, sore throat, chest pain, palpitations, cough, SOB, wheezing, n/v/d, constipation, blood in stool, dysuria, hematuria, at this time.  Past Medical History:  Diagnosis Date   Acute bronchitis 04/11/2015   GERD (gastroesophageal reflux disease)    PRN Tums    Hyperlipidemia 01/11/2015   Hypertension    Osteopenia    Vitamin D deficiency 01/29/2015    Past Surgical History:  Procedure Laterality Date   ECTOPIC PREGNANCY SURGERY  1985    Family History  Problem Relation Age of Onset   Hypertension Father    Cancer Father        prostate   Hyperlipidemia Father    Osteoarthritis Father        died of covid-19 10-09-19   Prostate cancer Father    Osteoarthritis Mother    Breast cancer Mother 67   Cancer Maternal Grandfather 58       colon   Colon polyps Maternal Grandfather  Esophageal cancer Neg Hx    Rectal cancer Neg Hx    Stomach cancer Neg Hx    Crohn's disease Neg Hx     Social History   Socioeconomic History   Marital status: Married    Spouse name: Not on file   Number of children: Not on file   Years of education: Not on file   Highest education level: Not on file  Occupational History   Not on file  Tobacco Use   Smoking status: Former    Types: Cigarettes    Quit date: 46    Years since quitting: 36.2   Smokeless tobacco:  Never  Substance and Sexual Activity   Alcohol use: Yes    Alcohol/week: 1.0 - 2.0 standard drink of alcohol    Types: 1 - 2 Cans of beer per week    Comment: has 2 drinks on weekend   Drug use: No    Frequency: 1.0 times per week   Sexual activity: Yes    Partners: Male  Other Topics Concern   Not on file  Social History Narrative   Married   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    2 children   F[place]ha[place] (daughter)- has 2 children (lives in [place])   [place] (1991)- lives will   Grew up in [place] Strain: [place]  (10/31/2021)   Overall Financial Resource Strain (CARDIA)    Difficulty of Paying Living Expenses: Not hard at all  Food Insecurity: No Food Insecurity (10/31/2021)   Hunger Vital Sign    Worried About Running Out of Food in the Last Year: Never true    [place] in the Last Year: Never true  Transportation Needs: No Transportation Needs (10/31/2021)   PRAPARE - Hydrologist (Medical): No    Lack of Transportation (Non-Medical): No  Physical Activity: Sufficiently Active (10/31/2021)   Exercise Vital Sign    Days of Exercise per Week: 7 days    Minutes of Exercise per Session: 40 min  Stress: No Stress Concern Present (10/31/2021)   [place]    Feeling of Stress : Not at all  Social Connections: [place] (10/31/2021)   Social Connection and Isolation Panel [NHANES]    Frequency of Communication with Friends and Family: More than three times a week    Frequency of Social Gatherings with Friends and Family: More than three times a week    Attends Religious Services: More than 4 times per year    Active Member of [employer] or Organizations: Yes    Attends Music therapist: More than 4 times per year    Marital Status: Married  Human resources officer Violence:  Not At Risk (10/31/2021)   Humiliation, Afraid, Rape, and Kick questionnaire    Fear of Current or Ex-Partner: No    Emotionally Abused: No    Physically Abused: No    Sexually Abused: No    Outpatient Medications Prior to Visit  Medication Sig Dispense Refill   Cholecalciferol (VITAMIN D3) 3000 UNITS TABS Take 1 tablet by mouth daily. 30 tablet    potassium chloride SA (KLOR-CON M) 20 MEQ tablet TAKE 1 TABLET BY MOUTH TWICE  DAILY 180 tablet 0   triamterene-hydrochlorothiazide (DYAZIDE) 37.5-25 MG capsule TAKE 1 CAPSULE BY MOUTH ONCE  DAILY 90 capsule 0  No facility-administered medications prior to visit.    No Known Allergies  Review of Systems  Constitutional:  Negative for fever.  HENT:  Negative for congestion, sinus pain and sore throat.   Respiratory:  Negative for cough, shortness of breath and wheezing.   Cardiovascular:  Negative for chest pain and palpitations.  Gastrointestinal:  Negative for blood in stool, constipation, diarrhea, nausea and vomiting.  Genitourinary:  Positive for frequency. Negative for dysuria and hematuria.  Musculoskeletal:  Positive for joint pain (Bilateral elbows). Negative for myalgias.  Skin:        (-) New moles.  Psychiatric/Behavioral:  The patient has insomnia.        +Stress       Objective:    Physical Exam Constitutional:      Appearance: Normal appearance.  HENT:     Head: Normocephalic and atraumatic.     Right Ear: Tympanic membrane, ear canal and external ear normal.     Left Ear: External ear normal. There is impacted cerumen.  Eyes:     Extraocular Movements: Extraocular movements intact.     Pupils: Pupils are equal, round, and reactive to light.  Cardiovascular:     Rate and Rhythm: Normal rate and regular rhythm.     Heart sounds: Normal heart sounds. No murmur heard.    No gallop.  Pulmonary:     Effort: Pulmonary effort is normal. No respiratory distress.     Breath sounds: Normal breath sounds. No wheezing  or rales.  Abdominal:     General: Bowel sounds are normal. There is no distension.     Palpations: Abdomen is soft.     Tenderness: There is no abdominal tenderness. There is no guarding.  Musculoskeletal:        General: Normal range of motion.     Comments: 5/5 muscle strength of bilateral upper and lower extremities.  Skin:    General: Skin is warm and dry.  Neurological:     General: No focal deficit present.     Mental Status: She is alert and oriented to person, place, and time.     Deep Tendon Reflexes:     Reflex Scores:      Patellar reflexes are 2+ on the right side and 2+ on the left side. Psychiatric:        Mood and Affect: Mood normal.        Behavior: Behavior normal.     BP 135/69 (BP Location: Left Arm, Patient Position: Sitting, Cuff Size: Small)   Pulse 73   Temp 98.4 F (36.9 C) (Oral)   Resp 16   Ht 5\' 2"  (1.575 m)   Wt 143 lb (64.9 kg)   LMP 01/05/2005   SpO2 98%   BMI 26.16 kg/m  Wt Readings from Last 3 Encounters:  09/26/22 143 lb (64.9 kg)  04/14/22 143 lb (64.9 kg)  09/20/21 146 lb (66.2 kg)      Assessment & Plan:   Problem List Items Addressed This Visit       Unprioritized   Preventative health care - Primary    Continue healthy diet and regular exercise.  Would like one more pap on another day.  Mammo and colo up to date. Immunizations reviewed and up to date.       Osteopenia    Not taking calcium supplement due to hypercalcemia.       Hypokalemia    Maintained on bid kdur.  See notes below. Obtain follow up  K+ today.       Hyperlipidemia    Lab Results  Component Value Date   CHOL 224 (H) 09/12/2021   HDL 87.30 09/12/2021   LDLCALC 112 (H) 09/12/2021   TRIG 123.0 09/12/2021   CHOLHDL 3 09/12/2021   Wt Readings from Last 3 Encounters:  09/26/22 143 lb (64.9 kg)  04/14/22 143 lb (64.9 kg)  09/20/21 146 lb (66.2 kg)        Relevant Medications   valsartan (DIOVAN) 80 MG tablet   Other Relevant Orders   Lipid  panel   HTN (hypertension)    Maintained on dyazide.  On Kdur secondary to Dyazide.  She does not like the frequent urination on dyazide.  Will plan to simplify her regimen by d/c'ing kdur and dyazide and instead beginning valsartan.  She will send me some updated readings in a few days.       Relevant Medications   valsartan (DIOVAN) 80 MG tablet   Anxiety    This is causing issues with sleep.  Recommend trial of lexapro 5 mg.       Relevant Medications   escitalopram (LEXAPRO) 5 MG tablet   Other Visit Diagnoses     Hypercalcemia       Relevant Orders   PTH, intact (no Ca)   Comp Met (CMET)        Meds ordered this encounter  Medications   escitalopram (LEXAPRO) 5 MG tablet    Sig: Take 1 tablet (5 mg total) by mouth daily.    Dispense:  30 tablet    Refill:  2    Order Specific Question:   Supervising Provider    Answer:   Penni Homans A [[id number]]   valsartan (DIOVAN) 80 MG tablet    Sig: Take 1 tablet (80 mg total) by mouth daily.    Dispense:  90 tablet    Refill:  0    Order Specific Question:   Supervising Provider    Answer:   Penni Homans A [[id number]]    I, Nance Pear, NP, personally preformed the services described in this documentation.  All medical record entries made by the scribe were at my direction and in my presence.  I have reviewed the chart and discharge instructions (if applicable) and agree that the record reflects my personal performance and is accurate and complete. 09/26/2022.  I,Mathew Stumpf,acting as a Education administrator for [employer], NP.,have documented all relevant documentation on the behalf of Nance Pear, NP,as directed by  Nance Pear, NP while in the presence of Nance Pear, NP.   Nance Pear, NP

## 2022-09-26 NOTE — Assessment & Plan Note (Addendum)
Maintained on dyazide.  On Kdur secondary to Dyazide.  She does not like the frequent urination on dyazide.  Will plan to simplify her regimen by d/c'ing kdur and dyazide and instead beginning valsartan.  She will send me some updated readings in a few days.

## 2022-09-26 NOTE — Assessment & Plan Note (Addendum)
Maintained on bid kdur.  See notes below. Obtain follow up K+ today.

## 2022-09-26 NOTE — Patient Instructions (Signed)
Stop potassium.  Stop dyazide. Start valsartan.

## 2022-09-27 LAB — PARATHYROID HORMONE, INTACT (NO CA): PTH: 25 pg/mL (ref 16–77)

## 2022-10-11 NOTE — Progress Notes (Signed)
Subjective:   By signing my name below, I, Jeanette Petty, attest that this documentation has been prepared under the direction and in the presence of Lemont Fillers, NP 10/13/22   [name], female    DOB: [birth year], 67 y.o.   MRN: [id number]  Chief Complaint  Patient presents with   Anxiety    Here for follow up   Hypertension    Here for follow up    HPI Patient is in today for a 2 week follow up.   Hypertension: Her blood pressure is well controlled during this visit. She has stopped taking Dyazide due to urinary frequency.   BP Readings from Last 3 Encounters:  10/13/22 123/70  09/26/22 135/69  04/14/22 139/84   Anxiety: She has been compliant with 5 mg Lexapro and notes it has been helping.   Past Medical History:  Diagnosis Date   Acute bronchitis 04/11/2015   GERD (gastroesophageal reflux disease)    PRN Tums    Hyperlipidemia 01/11/2015   Hypertension    Osteopenia    Vitamin D deficiency 01/29/2015    Past Surgical History:  Procedure Laterality Date   ECTOPIC PREGNANCY SURGERY  1985    Family History  Problem Relation Age of Onset   Hypertension Father    Cancer Father        prostate   Hyperlipidemia Father    Osteoarthritis Father        died of covid-19 10/27/2019   Prostate cancer Father    Osteoarthritis Mother    Breast cancer Mother 67   Cancer Maternal Grandfather 16       colon   Colon polyps Maternal Grandfather    Esophageal cancer Neg Hx    Rectal cancer Neg Hx    Stomach cancer Neg Hx    Crohn's disease Neg Hx     Social History   Socioeconomic History   Marital status: Married    Spouse name: Not on file   Number of children: Not on file   Years of education: Not on file   Highest education level: Doctorate  Occupational History   Not on file  Tobacco Use   Smoking status: Former    Types: Cigarettes    Quit date: 1988    Years since quitting: 36.3   Smokeless tobacco: Never  Substance and  Sexual Activity   Alcohol use: Yes    Alcohol/week: 1.0 - 2.0 standard drink of alcohol    Types: 1 - 2 Cans of beer per week    Comment: has 2 drinks on weekend   Drug use: No    Frequency: 1.0 times per week   Sexual activity: Yes    Partners: Male  Other Topics Concern   Not on file  Social History Narrative   Married   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 (lives in [place])   [id number]- Tobi Bastos 10-26-1989)- lives will   Grew up in [place]   Social Determinants of [employer] Strain: Low Risk  (10/12/2022)   Overall Financial Resource Strain (CARDIA)    Difficulty of Paying Living Expenses: Not hard at all  Food Insecurity: No Food Insecurity (10/12/2022)   Hunger Vital Sign    Worried About Running Out of Food in the Last Year: Never true    Ran Out of Food in the Last Year: Never  true  Transportation Needs: No Transportation Needs (10/12/2022)   PRAPARE - Administrator, Civil ServiceTransportation    Lack of Transportation (Medical): No    Lack of Transportation (Non-Medical): No  Physical Activity: Insufficiently Active (10/12/2022)   Exercise Vital Sign    Days of Exercise per Week: 3 days    Minutes of Exercise per Session: 40 min  Stress: Stress Concern Present (10/12/2022)   [employer] - Occupational Stress Questionnaire    Feeling of Stress : To some extent  Social Connections: [place]ially [place] (10/12/2022)   Social Connection and Isolation Panel [NHANES]    Frequency of Communication with Friends and Family: More than three times a week    Frequency of Social Gatherings with Friends and Family: More than three times a week    Attends Religious Services: 1 to 4 times per year    Active Member of [employer] or Organizations: Yes    Attends Engineer, structuralClub or Organization Meetings: More than 4 times per year    Marital Status: Married  Catering managerntimate Partner Violence: Not At Risk  (10/31/2021)   Humiliation, Afraid, Rape, and Kick questionnaire    Fear of Current or Ex-Partner: No    Emotionally Abused: No    Physically Abused: No    Sexually Abused: No    Outpatient Medications Prior to Visit  Medication Sig Dispense Refill   Cholecalciferol (VITAMIN D3) 3000 UNITS TABS Take 1 tablet by mouth daily. 30 tablet    escitalopram (LEXAPRO) 5 MG tablet Take 1 tablet (5 mg total) by mouth daily. 30 tablet 2   valsartan (DIOVAN) 80 MG tablet Take 1 tablet (80 mg total) by mouth daily. 90 tablet 0   No facility-administered medications prior to visit.    No Known Allergies  ROS    See HPI Objective:    Physical Exam Constitutional:      General: She is not in acute distress.    Appearance: Normal appearance. She is well-developed.  HENT:     Head: Normocephalic and atraumatic.     Right Ear: External ear normal.     Left Ear: External ear normal.  Eyes:     General: No scleral icterus. Neck:     Thyroid: No thyromegaly.  Cardiovascular:     Rate and Rhythm: Normal rate and regular rhythm.     Heart sounds: Normal heart sounds. No murmur heard. Pulmonary:     Effort: Pulmonary effort is normal. No respiratory distress.     Breath sounds: Normal breath sounds. No wheezing.  Musculoskeletal:     Cervical back: Neck supple.  Skin:    General: Skin is warm and dry.  Neurological:     Mental Status: She is alert and oriented to person, place, and time.  Psychiatric:        Mood and Affect: Mood normal.        Behavior: Behavior normal.        Thought Content: Thought content normal.        Judgment: Judgment normal.     BP 123/70 (BP Location: Right Arm, Patient Position: Sitting, Cuff Size: Small)   Pulse 65   Temp 98.4 F (36.9 C) (Oral)   Resp 16   Wt 145 lb (65.8 kg)   LMP 01/05/2005   SpO2 98%   BMI 26.52 kg/m  Wt Readings from Last 3 Encounters:  10/13/22 145 lb (65.8 kg)  09/26/22 143 lb (64.9 kg)  04/14/22 143 lb (64.9 kg)  Assessment & Plan:  Primary hypertension Assessment & Plan: BP looks great.  She is glad not to have the urinary frequency that she was having on maxide.  Continue diovan. Plan to recheck bmet today.  BP Readings from Last 3 Encounters:  10/13/22 123/70  09/26/22 135/69  04/14/22 139/84     Orders: -     Basic metabolic panel  Anxiety Assessment & Plan: Last visit we began lexapro 5mg . She feels like this has been helping her to manage her stress better and to sleep better. Continue same.       I,Rachel Rivera,acting as a Neurosurgeon for Lemont Fillers, NP.,have documented all relevant documentation on the behalf of Lemont Fillers, NP,as directed by  Lemont Fillers, NP while in the presence of Lemont Fillers, NP.   I, Lemont Fillers, NP, personally preformed the services described in this documentation.  All medical record entries made by the scribe were at my direction and in my presence.  I have reviewed the chart and discharge instructions (if applicable) and agree that the record reflects my personal performance and is accurate and complete. 10/13/22   Lemont Fillers, NP

## 2022-10-13 ENCOUNTER — Ambulatory Visit (INDEPENDENT_AMBULATORY_CARE_PROVIDER_SITE_OTHER): Payer: Medicare Other | Admitting: Family

## 2022-10-13 VITALS — BP 123/70 | HR 65 | Temp 98.4°F | Resp 16 | Wt 145.0 lb

## 2022-10-13 DIAGNOSIS — I10 Essential (primary) hypertension: Secondary | ICD-10-CM | POA: Diagnosis not present

## 2022-10-13 DIAGNOSIS — F419 Anxiety disorder, unspecified: Secondary | ICD-10-CM | POA: Diagnosis not present

## 2022-10-13 NOTE — Assessment & Plan Note (Signed)
BP looks great.  She is glad not to have the urinary frequency that she was having on maxide.  Continue diovan. Plan to recheck bmet today.  BP Readings from Last 3 Encounters:  10/13/22 123/70  09/26/22 135/69  04/14/22 139/84

## 2022-10-13 NOTE — Assessment & Plan Note (Signed)
Last visit we began lexapro 5mg . She feels like this has been helping her to manage her stress better and to sleep better. Continue same.

## 2022-10-14 LAB — BASIC METABOLIC PANEL
BUN: 12 mg/dL (ref 7–25)
CO2: 26 mmol/L (ref 20–32)
Calcium: 9.7 mg/dL (ref 8.6–10.4)
Chloride: 103 mmol/L (ref 98–110)
Creat: 0.73 mg/dL (ref 0.50–1.05)
Glucose, Bld: 81 mg/dL (ref 65–99)
Potassium: 4.6 mmol/L (ref 3.5–5.3)
Sodium: 140 mmol/L (ref 135–146)

## 2022-10-16 ENCOUNTER — Encounter: Payer: Self-pay | Admitting: *Deleted

## 2022-10-18 ENCOUNTER — Telehealth: Payer: Self-pay | Admitting: Family

## 2022-10-18 NOTE — Telephone Encounter (Signed)
Contacted [name] to schedule their annual wellness visit. Appointment made for 11/02/2022.  Verlee Rossetti; Care Guide Ambulatory Clinical Support [place] [facility] Direct Dial: [phone]

## 2022-10-27 ENCOUNTER — Other Ambulatory Visit: Payer: Self-pay | Admitting: Family

## 2022-11-02 ENCOUNTER — Ambulatory Visit (INDEPENDENT_AMBULATORY_CARE_PROVIDER_SITE_OTHER): Payer: Medicare Other | Admitting: *Deleted

## 2022-11-02 VITALS — Ht 62.0 in | Wt 145.0 lb

## 2022-11-02 DIAGNOSIS — Z0000 Encounter for general adult medical examination without abnormal findings: Secondary | ICD-10-CM | POA: Diagnosis not present

## 2022-11-02 NOTE — Progress Notes (Signed)
Subjective:  Pt completed ADLs, Fall risk, and SDOH during e-check in on 10/27/22.  Answers verified with pt.    Jeanette Petty is a 67 y.o. female who presents for Medicare Annual (Subsequent) preventive examination.  I connected with  Jeanette Petty on 11/02/22 by a audio enabled telemedicine application and verified that I am speaking with the correct person using two identifiers.  Patient Location: Home  Provider Location: Office/Clinic  I discussed the limitations of evaluation and management by telemedicine. The patient expressed understanding and agreed to proceed.   Review of Systems     Cardiac Risk Factors include: advanced age (>61men, >76 women);dyslipidemia;hypertension     Objective:    Today's Vitals   11/02/22 0902  Weight: 145 lb (65.8 kg)  Height: 5\' 2"  (1.575 m)   Body mass index is 26.52 kg/m.     11/02/2022    9:08 AM 10/31/2021   10:22 AM  Advanced Directives  Does Patient Have a Medical Advance Directive? Yes Yes  Type of Estate agent of [place];Living will Healthcare Power of [place];Out of facility DNR (pink MOST or yellow form);Living will  Copy of Healthcare Power of Attorney in Chart? No - copy requested No - copy requested    Current Medications (verified) Outpatient Encounter Medications as of 11/02/2022  Medication Sig   Cholecalciferol (VITAMIN D3) 3000 UNITS TABS Take 1 tablet by mouth daily.   escitalopram (LEXAPRO) 5 MG tablet Take 1 tablet (5 mg total) by mouth daily.   valsartan (DIOVAN) 80 MG tablet Take 1 tablet (80 mg total) by mouth daily.   No facility-administered encounter medications on file as of 11/02/2022.    Allergies (verified) Patient has no known allergies.   History: Past Medical History:  Diagnosis Date   Acute bronchitis 04/11/2015   Anxiety November 2020   Insomnia--father ill and now in Hospice.  Continued now with aging mother and daughter's infertility issues.   GERD  (gastroesophageal reflux disease)    PRN Tums    Hyperlipidemia 01/11/2015   Hypertension    Osteopenia    Vitamin D deficiency 01/29/2015   Past Surgical History:  Procedure Laterality Date   ECTOPIC PREGNANCY SURGERY  1985   Family History  Problem Relation Age of Onset   Hypertension Father    Cancer Father        prostate   Hyperlipidemia Father    Osteoarthritis Father        died of covid-19 Nov 09, 2019   Prostate cancer Father    Osteoarthritis Mother    Breast cancer Mother 61   Arthritis Mother    Cancer Maternal Grandfather 21       colon   Colon polyps Maternal Grandfather    Arthritis Maternal Grandfather    Esophageal cancer Neg Hx    Rectal cancer Neg Hx    Stomach cancer Neg Hx    Crohn's disease Neg Hx    Social History   Socioeconomic History   Marital status: Married    Spouse name: Not on file   Number of children: Not on file   Years of education: Not on file   Highest education level: Doctorate  Occupational History   Not on file  Tobacco Use   Smoking status: Former    Packs/day: 1.00    Years: 4.00    Additional pack years: 0.00    Total pack years: 4.00    Types: Cigarettes    Quit date: 07/03/1986    Years  since quitting: 36.3   Smokeless tobacco: Never  Substance and Sexual Activity   Alcohol use: Yes    Alcohol/week: 3.0 standard drinks of alcohol    Types: 3 Glasses of wine per week    Comment: has 2 drinks on weekend   Drug use: No    Frequency: 1.0 times per week   Sexual activity: Not Currently    Partners: Male  Other Topics Concern   Not on file  Social History Narrative   Married   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 (lives in [place])   [id number]- Tobi Bastos (1991)- lives will   Grew up in [place]   Social Determinants of [employer] Strain: Low Risk  (10/27/2022)   Overall Financial Resource Strain (CARDIA)     Difficulty of Paying Living Expenses: Not hard at all  Food Insecurity: No Food Insecurity (10/27/2022)   Hunger Vital Sign    Worried About Running Out of Food in the Last Year: Never true    Ran Out of Food in the Last Year: Never true  Transportation Needs: No Transportation Needs (10/27/2022)   PRAPARE - Administrator, Civil Service (Medical): No    Lack of Transportation (Non-Medical): No  Physical Activity: Insufficiently Active (10/27/2022)   Exercise Vital Sign    Days of Exercise per Week: 2 days    Minutes of Exercise per Session: 60 min  Stress: Stress Concern Present (10/27/2022)   [employer] of Occupational Health - Occupational Stress Questionnaire    Feeling of Stress : To some extent  Social Connections: Socially Integrated (10/27/2022)   Social Connection and Isolation Panel [NHANES]    Frequency of Communication with Friends and Family: More than three times a week    Frequency of Social Gatherings with Friends and Family: Three times a week    Attends Religious Services: 1 to 4 times per year    Active Member of Clubs or Organizations: Yes    Attends Banker Meetings: 1 to 4 times per year    Marital Status: Married    Tobacco Counseling Counseling given: Not Answered   Clinical Intake:  Pre-visit preparation completed: Yes  Pain : No/denies pain  BMI - recorded: 26.52 Nutritional Status: BMI 25 -29 Overweight Nutritional Risks: None Diabetes: No  How often do you need to have someone help you when you read instructions, pamphlets, or other written materials from your doctor or pharmacy?: 1 - Never  Activities of Daily Living    10/27/2022    9:54 PM  In your present state of health, do you have any difficulty performing the following activities:  Hearing? 0  Vision? 0  Difficulty concentrating or making decisions? 0  Walking or climbing stairs? 0  Dressing or bathing? 0  Doing errands, shopping? 0  Preparing Food and  eating ? N  Using the Toilet? N  In the past six months, have you accidently leaked urine? N  Do you have problems with loss of bowel control? N  Managing your Medications? N  Managing your Finances? N  Housekeeping or managing your Housekeeping? N    Patient Care Team: Sandford Craze, NP as PCP - General (Internal Medicine) Malena Peer, MD (Obstetrics and Gynecology)  Indicate any recent Medical Services you may have received from other than [facility] providers in the past year (date may be approximate).  Assessment:   This is a routine wellness examination for Jeanette Petty.  Hearing/Vision screen No results found.  Dietary issues and exercise activities discussed: Current Exercise Habits: Home exercise routine, Type of exercise: walking, Time (Minutes): 60, Frequency (Times/Week): 2, Weekly Exercise (Minutes/Week): 120, Intensity: Moderate, Exercise limited by: None identified   Goals Addressed   None    Depression Screen    11/02/2022    9:04 AM 09/26/2022   12:59 PM 10/31/2021   10:23 AM 09/12/2021    8:21 AM 09/10/2020   11:02 AM 07/29/2019    9:44 AM 12/20/2017    3:44 PM  PHQ 2/9 Scores  PHQ - 2 Score 0 0 0 0 0 0 0  PHQ- 9 Score       1    Fall Risk    10/27/2022    9:54 PM 09/26/2022   12:59 PM 10/31/2021   10:23 AM 09/12/2021    8:22 AM  Fall Risk   Falls in the past year? 0 0 0 0  Number falls in past yr: 0 0 0 0  Injury with Fall? 0 0 0 0  Risk for fall due to : No Fall Risks No Fall Risks No Fall Risks   Follow up Falls evaluation completed Falls evaluation completed Falls evaluation completed     FALL RISK PREVENTION PERTAINING TO THE HOME:  Any stairs in or around the home? Yes  If so, are there any without handrails? No  Home free of loose throw rugs in walkways, pet beds, electrical cords, etc? Yes  Adequate lighting in your home to reduce risk of falls? Yes   ASSISTIVE DEVICES UTILIZED TO PREVENT FALLS:  Life alert? No  Use of a cane, walker  or w/c? No  Grab bars in the bathroom? Yes  Shower chair or bench in shower? No  Elevated toilet seat or a handicapped toilet? No   TIMED UP AND GO:  Was the test performed?  No, audio visit .    Cognitive Function:        11/02/2022    9:10 AM 10/31/2021   10:26 AM  6CIT Screen  What Year? 0 points 0 points  What month? 0 points 0 points  What time? 0 points 0 points  Count back from 20 0 points 0 points  Months in reverse 0 points 0 points  Repeat phrase 0 points 0 points  Total Score 0 points 0 points    Immunizations Immunization History  Administered Date(s) Administered   COVID-19, mRNA, vaccine(Comirnaty)12 years and older 08/26/2022   Influenza, High Dose Seasonal PF 03/03/2021   Influenza,inj,Quad PF,6+ Mos 04/21/2018   Influenza,inj,Quad PF,6-35 Mos 04/03/2019   Influenza-Unspecified 03/17/2022   PFIZER(Purple Top)SARS-COV-2 Vaccination 09/03/2019, 10/01/2019, 04/30/2020   PNEUMOCOCCAL CONJUGATE-20 09/12/2021   Pfizer Covid-19 Vaccine Bivalent Booster 19yrs & up 04/15/2021   Pneumococcal Polysaccharide-23 09/10/2020   Respiratory Syncytial Virus Vaccine,Recomb Aduvanted(Arexvy) 03/17/2022   Tdap 01/06/2015   Zoster Recombinat (Shingrix) 07/29/2019, 10/21/2019    TDAP status: Up to date  Flu Vaccine status: Up to date  Pneumococcal vaccine status: Up to date  Covid-19 vaccine status: Completed vaccines  Qualifies for Shingles Vaccine? Yes   Zostavax completed No   Shingrix Completed?: Yes  Screening Tests Health Maintenance  Topic Date Due   Medicare Annual Wellness (AWV)  11/01/2022   Hepatitis C Screening  04/15/2023 (Originally 07/09/1973)   INFLUENZA VACCINE  02/01/2023   COLONOSCOPY (Pts 45-20yrs Insurance coverage will need to be confirmed)  08/18/2023  MAMMOGRAM  11/25/2023   DTaP/Tdap/Td (2 - Td or Tdap) 01/05/2025   Pneumonia Vaccine 3+ Years old  Completed   DEXA SCAN  Completed   COVID-19 Vaccine  Completed   Zoster Vaccines-  Shingrix  Completed   HPV VACCINES  Aged Out    Health Maintenance  Health Maintenance Due  Topic Date Due   Medicare Annual Wellness (AWV)  11/01/2022    Colorectal cancer screening: Type of screening: Colonoscopy. Completed 08/17/20. Repeat every 3 years  Mammogram status: Completed 11/24/21. Repeat every year  Bone Density status: Completed 10/06/21. Results reflect: Bone density results: OSTEOPENIA. Repeat every 2 years.  Lung Cancer Screening: (Low Dose CT Chest recommended if Age 36-80 years, 30 pack-year currently smoking OR have quit w/in 15years.) does not qualify.   Additional Screening:  Hepatitis C Screening: does qualify; Completed N/a  Vision Screening: Recommended annual ophthalmology exams for early detection of glaucoma and other disorders of the eye. Is the patient up to date with their annual eye exam?  Yes  Who is the provider or what is the name of the office in which the patient attends annual eye exams? [facility] If pt is not established with a provider, would they like to be referred to a provider to establish care? No .   Dental Screening: Recommended annual dental exams for proper oral hygiene  Community Resource Referral / Chronic Care Management: CRR required this visit?  No   CCM required this visit?  No      Plan:     I have personally reviewed and noted the following in the patient's chart:   Medical and social history Use of alcohol, tobacco or illicit drugs  Current medications and supplements including opioid prescriptions. Patient is not currently taking opioid prescriptions. Functional ability and status Nutritional status Physical activity Advanced directives List of other physicians Hospitalizations, surgeries, and ER visits in previous 12 months Vitals Screenings to include cognitive, depression, and falls Referrals and appointments  In addition, I have reviewed and discussed with patient certain preventive protocols,  quality metrics, and best practice recommendations. A written personalized care plan for preventive services as well as general preventive health recommendations were provided to patient.   Due to this being a telephonic visit, the after visit summary with patients personalized plan was offered to patient via mail or my-chart.  Patient would like to access on my-chart.   Jeanette Petty, [place]   11/02/2022   Nurse Notes: None

## 2022-11-02 NOTE — Patient Instructions (Signed)
Jeanette Petty , Thank you for taking time to come for your Medicare Wellness Visit. I appreciate your ongoing commitment to your health goals. Please review the following plan we discussed and let me know if I can assist you in the future.   These are the goals we discussed:  Goals   None     This is a list of the screening recommended for you and due dates:  Health Maintenance  Topic Date Due   Hepatitis C Screening: USPSTF Recommendation to screen - Ages 18-79 yo.  04/15/2023*   Flu Shot  02/01/2023   Colon Cancer Screening  08/18/2023   Medicare Annual Wellness Visit  11/02/2023   Mammogram  11/25/2023   DTaP/Tdap/Td vaccine (2 - Td or Tdap) 01/05/2025   Pneumonia Vaccine  Completed   DEXA scan (bone density measurement)  Completed   COVID-19 Vaccine  Completed   Zoster (Shingles) Vaccine  Completed   HPV Vaccine  Aged Out  *Topic was postponed. The date shown is not the original due date.     Next appointment: Follow up in one year for your annual wellness visit.   Preventive Care 73 Years and Older, Female Preventive care refers to lifestyle choices and visits with your health care provider that can promote health and wellness. What does preventive care include? A yearly physical exam. This is also called an annual well check. Dental exams once or twice a year. Routine eye exams. Ask your health care provider how often you should have your eyes checked. Personal lifestyle choices, including: Daily care of your teeth and gums. Regular physical activity. Eating a healthy diet. Avoiding tobacco and drug use. Limiting alcohol use. Practicing safe sex. Taking low-dose aspirin every day. Taking vitamin and mineral supplements as recommended by your health care provider. What happens during an annual well check? The services and screenings done by your health care provider during your annual well check will depend on your age, overall health, lifestyle risk factors, and  family history of disease. Counseling  Your health care provider may ask you questions about your: Alcohol use. Tobacco use. Drug use. Emotional well-being. Home and relationship well-being. Sexual activity. Eating habits. History of falls. Memory and ability to understand (cognition). Work and work Astronomer. Reproductive health. Screening  You may have the following tests or measurements: Height, weight, and BMI. Blood pressure. Lipid and cholesterol levels. These may be checked every 5 years, or more frequently if you are over 60 years old. Skin check. Lung cancer screening. You may have this screening every year starting at age 79 if you have a 30-pack-year history of smoking and currently smoke or have quit within the past 15 years. Fecal occult blood test (FOBT) of the stool. You may have this test every year starting at age 70. Flexible sigmoidoscopy or colonoscopy. You may have a sigmoidoscopy every 5 years or a colonoscopy every 10 years starting at age 69. Hepatitis C blood test. Hepatitis B blood test. Sexually transmitted disease (STD) testing. Diabetes screening. This is done by checking your blood sugar (glucose) after you have not eaten for a while (fasting). You may have this done every 1-3 years. Bone density scan. This is done to screen for osteoporosis. You may have this done starting at age 47. Mammogram. This may be done every 1-2 years. Talk to your health care provider about how often you should have regular mammograms. Talk with your health care provider about your test results, treatment options, and if necessary,  the need for more tests. Vaccines  Your health care provider may recommend certain vaccines, such as: Influenza vaccine. This is recommended every year. Tetanus, diphtheria, and acellular pertussis (Tdap, Td) vaccine. You may need a Td booster every 10 years. Zoster vaccine. You may need this after age 61. Pneumococcal 13-valent conjugate  (PCV13) vaccine. One dose is recommended after age 39. Pneumococcal polysaccharide (PPSV23) vaccine. One dose is recommended after age 13. Talk to your health care provider about which screenings and vaccines you need and how often you need them. This information is not intended to replace advice given to you by your health care provider. Make sure you discuss any questions you have with your health care provider. Document Released: 07/16/2015 Document Revised: 03/08/2016 Document Reviewed: 04/20/2015 Elsevier Interactive Patient Education  2017 [employer] Prevention in the Home Falls can cause injuries. They can happen to people of all ages. There are many things you can do to make your home safe and to help prevent falls. What can I do on the outside of my home? Regularly fix the edges of walkways and driveways and fix any cracks. Remove anything that might make you trip as you walk through a door, such as a raised step or threshold. Trim any bushes or trees on the path to your home. Use bright outdoor lighting. Clear any walking paths of anything that might make someone trip, such as rocks or tools. Regularly check to see if handrails are loose or broken. Make sure that both sides of any steps have handrails. Any raised decks and porches should have guardrails on the edges. Have any leaves, snow, or ice cleared regularly. Use sand or salt on walking paths during winter. Clean up any spills in your garage right away. This includes oil or grease spills. What can I do in the bathroom? Use night lights. Install grab bars by the toilet and in the tub and shower. Do not use towel bars as grab bars. Use non-skid mats or decals in the tub or shower. If you need to sit down in the shower, use a plastic, non-slip stool. Keep the floor dry. Clean up any water that spills on the floor as soon as it happens. Remove soap buildup in the tub or shower regularly. Attach bath mats securely with  double-sided non-slip rug tape. Do not have throw rugs and other things on the floor that can make you trip. What can I do in the bedroom? Use night lights. Make sure that you have a light by your bed that is easy to reach. Do not use any sheets or blankets that are too big for your bed. They should not hang down onto the floor. Have a firm chair that has side arms. You can use this for support while you get dressed. Do not have throw rugs and other things on the floor that can make you trip. What can I do in the kitchen? Clean up any spills right away. Avoid walking on wet floors. Keep items that you use a lot in easy-to-reach places. If you need to reach something above you, use a strong step stool that has a grab bar. Keep electrical cords out of the way. Do not use floor polish or wax that makes floors slippery. If you must use wax, use non-skid floor wax. Do not have throw rugs and other things on the floor that can make you trip. What can I do with my stairs? Do not leave any items on  the stairs. Make sure that there are handrails on both sides of the stairs and use them. Fix handrails that are broken or loose. Make sure that handrails are as long as the stairways. Check any carpeting to make sure that it is firmly attached to the stairs. Fix any carpet that is loose or worn. Avoid having throw rugs at the top or bottom of the stairs. If you do have throw rugs, attach them to the floor with carpet tape. Make sure that you have a light switch at the top of the stairs and the bottom of the stairs. If you do not have them, ask someone to add them for you. What else can I do to help prevent falls? Wear shoes that: Do not have high heels. Have rubber bottoms. Are comfortable and fit you well. Are closed at the toe. Do not wear sandals. If you use a stepladder: Make sure that it is fully opened. Do not climb a closed stepladder. Make sure that both sides of the stepladder are locked  into place. Ask someone to hold it for you, if possible. Clearly mark and make sure that you can see: Any grab bars or handrails. First and last steps. Where the edge of each step is. Use tools that help you move around (mobility aids) if they are needed. These include: Canes. Walkers. Scooters. Crutches. Turn on the lights when you go into a dark area. Replace any light bulbs as soon as they burn out. Set up your furniture so you have a clear path. Avoid moving your furniture around. If any of your floors are uneven, fix them. If there are any pets around you, be aware of where they are. Review your medicines with your doctor. Some medicines can make you feel dizzy. This can increase your chance of falling. Ask your doctor what other things that you can do to help prevent falls. This information is not intended to replace advice given to you by your health care provider. Make sure you discuss any questions you have with your health care provider. Document Released: 04/15/2009 Document Revised: 11/25/2015 Document Reviewed: 07/24/2014 Elsevier Interactive Patient Education  2017 [employer].

## 2022-11-27 ENCOUNTER — Other Ambulatory Visit: Payer: Self-pay | Admitting: Family

## 2022-11-27 DIAGNOSIS — I10 Essential (primary) hypertension: Secondary | ICD-10-CM

## 2022-12-05 DIAGNOSIS — Z1231 Encounter for screening mammogram for malignant neoplasm of breast: Secondary | ICD-10-CM | POA: Diagnosis not present

## 2022-12-05 LAB — HM MAMMOGRAPHY

## 2022-12-16 ENCOUNTER — Other Ambulatory Visit: Payer: Self-pay | Admitting: Family

## 2023-03-17 ENCOUNTER — Other Ambulatory Visit: Payer: Self-pay | Admitting: Family

## 2023-03-27 ENCOUNTER — Other Ambulatory Visit: Payer: Self-pay

## 2023-03-27 MED ORDER — ESCITALOPRAM OXALATE 5 MG PO TABS: 5.0000 mg | ORAL_TABLET | Freq: Every day | ORAL | 1 refills | Status: DC

## 2023-03-27 NOTE — Telephone Encounter (Signed)
Rx request for escitalopram

## 2023-05-01 ENCOUNTER — Telehealth: Payer: Self-pay | Admitting: Family

## 2023-05-01 NOTE — Telephone Encounter (Signed)
Yes, but make sure if she restarts diazide that she restarts the Kdur as well.  Also check bp at home and let me know if bp is >150/90.  Please schedule follow up visit when she is back in town.

## 2023-05-01 NOTE — Telephone Encounter (Signed)
Pt states the new bp medicine is giving her diarrhea and she went back to her old bp rx. She would like to know if that is okay as she is going out of town and won't be able to see pcp for a couple weeks. Please advise.

## 2023-05-01 NOTE — Telephone Encounter (Signed)
Patient notified of this information and she will follow instructions, she will call for follow  up once she returns.

## 2023-05-02 ENCOUNTER — Ambulatory Visit: Payer: Medicare Other | Admitting: Family

## 2023-05-07 DIAGNOSIS — Z20822 Contact with and (suspected) exposure to covid-19: Secondary | ICD-10-CM | POA: Diagnosis not present

## 2023-05-07 DIAGNOSIS — R059 Cough, unspecified: Secondary | ICD-10-CM | POA: Diagnosis not present

## 2023-05-16 ENCOUNTER — Ambulatory Visit: Payer: Medicare Other | Admitting: Family

## 2023-05-16 VITALS — BP 136/71 | HR 75 | Temp 98.4°F | Resp 16 | Ht 62.0 in | Wt 143.0 lb

## 2023-05-16 DIAGNOSIS — E785 Hyperlipidemia, unspecified: Secondary | ICD-10-CM | POA: Diagnosis not present

## 2023-05-16 DIAGNOSIS — E559 Vitamin D deficiency, unspecified: Secondary | ICD-10-CM

## 2023-05-16 DIAGNOSIS — I10 Essential (primary) hypertension: Secondary | ICD-10-CM | POA: Diagnosis not present

## 2023-05-16 DIAGNOSIS — Z1159 Encounter for screening for other viral diseases: Secondary | ICD-10-CM | POA: Diagnosis not present

## 2023-05-16 DIAGNOSIS — F419 Anxiety disorder, unspecified: Secondary | ICD-10-CM | POA: Diagnosis not present

## 2023-05-16 DIAGNOSIS — Z1211 Encounter for screening for malignant neoplasm of colon: Secondary | ICD-10-CM

## 2023-05-16 LAB — COMPREHENSIVE METABOLIC PANEL
ALT: 20 U/L (ref 0–35)
AST: 18 U/L (ref 0–37)
Albumin: 4.5 g/dL (ref 3.5–5.2)
Alkaline Phosphatase: 65 U/L (ref 39–117)
BUN: 14 mg/dL (ref 6–23)
CO2: 30 meq/L (ref 19–32)
Calcium: 10.3 mg/dL (ref 8.4–10.5)
Chloride: 102 meq/L (ref 96–112)
Creatinine, Ser: 0.85 mg/dL (ref 0.40–1.20)
GFR: 70.68 mL/min (ref >60.00)
Glucose, Bld: 89 mg/dL (ref 70–99)
Potassium: 4.6 meq/L (ref 3.5–5.1)
Sodium: 141 meq/L (ref 135–145)
Total Bilirubin: 0.6 mg/dL (ref 0.2–1.2)
Total Protein: 7.3 g/dL (ref 6.0–8.3)

## 2023-05-16 LAB — LIPID PANEL
Cholesterol: 241 mg/dL — ABNORMAL HIGH (ref 0–200)
HDL: 88.8 mg/dL (ref >39.00)
LDL Cholesterol: 128 mg/dL — ABNORMAL HIGH (ref 0–99)
NonHDL: 152.58
Total CHOL/HDL Ratio: 3
Triglycerides: 121 mg/dL (ref 0.0–149.0)
VLDL: 24.2 mg/dL (ref 0.0–40.0)

## 2023-05-16 LAB — VITAMIN D 25 HYDROXY (VIT D DEFICIENCY, FRACTURES): VITD: 46.1 ng/mL (ref 30.00–100.00)

## 2023-05-16 MED ORDER — TRIAMTERENE-HCTZ 37.5-25 MG PO TABS
1.0000 | ORAL_TABLET | Freq: Every day | ORAL | Status: DC
Start: 2023-05-16 — End: 2023-06-07

## 2023-05-16 MED ORDER — POTASSIUM CHLORIDE CRYS ER 20 MEQ PO TBCR: 20.0000 meq | EXTENDED_RELEASE_TABLET | Freq: Every day | ORAL | Status: DC

## 2023-05-16 NOTE — Patient Instructions (Signed)
VISIT SUMMARY:  During today's visit, we discussed your concerns about your blood pressure medication and your recent changes to your treatment plan. We also reviewed your history of anxiety, bone health, and upcoming screenings. Your blood pressure is currently well-controlled, and you have made some adjustments to your medications that we will continue to monitor.  YOUR PLAN:  -HYPERTENSION: Hypertension, or high blood pressure, is a condition where the force of the blood against your artery walls is too high. You experienced loose stools with Valsartan and switched back to potassium, which has controlled your blood pressure at 136/71. Continue taking potassium for now, and we can discuss alternative medications if urinary frequency becomes an issue.  -ANXIETY: Anxiety is a condition characterized by feelings of worry or fear. You stopped taking Lexapro about a month ago due to improved sleep and reduced stress, and you have been feeling well since. We will discontinue Lexapro.  -OSTEOPENIA: Osteopenia is a condition where bone mineral density is lower than normal, which can be a precursor to osteoporosis. Continue taking calcium and vitamin D supplements. We will check your vitamin D levels today.  -COLON CANCER SCREENING: Colon cancer screening involves checking for cancer or precancerous growths in the colon. You had a polyp removed during your last colonoscopy in 2022, and a repeat colonoscopy is recommended in February 2025. We will send a referral for this.  -CERVICAL CANCER SCREENING: Cervical cancer screening involves testing for precancerous or cancerous cells on the cervix. Your last Pap smear in 2021 was normal. We will schedule a physical exam, including a Pap smear, at your convenience.  -GENERAL HEALTH MAINTENANCE: For general health, continue with your annual flu shot and COVID vaccination. We will perform a one-time Hepatitis C screening today. Continue taking potassium once daily,  and we will adjust if necessary based on your levels.  INSTRUCTIONS:  Please continue taking your potassium medication once daily and monitor for any changes in urinary frequency. We will check your vitamin D levels today, and you should continue with your calcium and vitamin D supplements. A referral for your colonoscopy in February 2025 will be sent, and we will schedule your physical exam and Pap smear at your convenience. If you have any concerns or experience any new symptoms, please contact our office.

## 2023-05-16 NOTE — Assessment & Plan Note (Signed)
Continue vit D 3000 international units daily.

## 2023-05-16 NOTE — Progress Notes (Signed)
Subjective:     [name], female    DOB: 1956-02-17, 67 y.o.   MRN: [id number]  Chief Complaint  Patient presents with   Hypertension    Patient reports she discontinue diovan 80 mg due to diarrhea.  Taking triam/hydrochlorothiazide 37.5/25 mg    Hypertension    Discussed the use of AI scribe software for clinical note transcription with the patient, who gave verbal consent to proceed.  History of Present Illness   The patient, with a history of hypertension, presents with concerns about her blood pressure medication, Valsartan. She reports experiencing loose stools, which she attributes to the medication after researching potential side effects online. To manage this, she switched back to her previous medication, triamterene-hctz, and noticed an improvement in her stool consistency. However, she also reports urinary frequency, which was the initial reason for switching off the diuretic. Despite this, she expresses a preference to stay on triamterene-hctz for now, adjusting the timing of the dose as needed to manage the urinary frequency.  In addition to her hypertension, the patient has a history of anxiety, for which she was prescribed Lexapro. However, she reports improved sleep and a reduction in emotional stress related to her mother's transition to assisted living. As a result, she decided to stop taking the Lexapro about a month ago and reports feeling fine since then.  The patient also has a hx of mild osteopenia.  Her last dexa was in 05-26-2022. She continues taking vitamin D 3000 iuShe is due for a colonoscopy in February due to a polyp removal during her last procedure in 05/26/2021. She has received her flu shot and COVID vaccine for the season.     BP Readings from Last 3 Encounters:  05/16/23 136/71  10/13/22 123/70  09/26/22 135/69        Health Maintenance Due  Topic Date Due   Hepatitis C Screening  Never done   Colonoscopy  08/18/2023    Past Medical  History:  Diagnosis Date   Acute bronchitis 04/11/2015   Anxiety 2019/05/27  Insomnia--father ill and now in Hospice.  Continued now with aging mother and daughter's infertility issues.   GERD (gastroesophageal reflux disease)    PRN Tums    Hyperlipidemia 01/11/2015   Hypertension    Osteopenia    Vitamin D deficiency 01/29/2015    Past Surgical History:  Procedure Laterality Date   ECTOPIC PREGNANCY SURGERY  1985    Family History  Problem Relation Age of Onset   Hypertension Father    Cancer Father        prostate   Hyperlipidemia Father    Osteoarthritis Father        died of covid-19 05/26/20   Prostate cancer Father    Osteoarthritis Mother    Breast cancer Mother 76   Arthritis Mother    Cancer Maternal Grandfather 17       colon   Colon polyps Maternal Grandfather    Arthritis Maternal Grandfather    Esophageal cancer Neg Hx    Rectal cancer Neg Hx    Stomach cancer Neg Hx    Crohn's disease Neg Hx     Social History   Socioeconomic History   Marital status: Married    Spouse name: Not on file   Number of children: Not on file   Years of education: Not on file   Highest education level: Doctorate  Occupational History   Not on file  Tobacco Use  Smoking status: Former    Current packs/day: 0.00    Average packs/day: 1 pack/day for 4.0 years (4.0 ttl pk-yrs)    Types: Cigarettes    Start date: 07/03/1982    Quit date: 07/03/1986    Years since quitting: 36.8   Smokeless tobacco: Never  Substance and Sexual Activity   Alcohol use: Yes    Alcohol/week: 3.0 standard drinks of alcohol    Types: 3 Glasses of wine per week    Comment: has 2 drinks on weekend   Drug use: No    Frequency: 1.0 times per week   Sexual activity: Not Currently    Partners: Male  Other Topics Concern   Not on file  Social History Narrative   Married   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   1988- Orthoptist (daughter)- has 2  children (lives in [place])   1308- Tobi Bastos (1991)- lives will   Grew up in [place]   Social Determinants of [employer]: Low Risk  (05/01/2023)   Overall Financial Resource Strain (CARDIA)    Difficulty of Paying Living Expenses: Not hard at all  Food Insecurity: No Food Insecurity (05/01/2023)   Hunger Vital Sign    Worried About Running Out of Food in the Last Year: Never true    Ran Out of Food in the Last Year: Never true  Transportation Needs: No Transportation Needs (05/01/2023)   PRAPARE - Administrator, Civil Service (Medical): No    Lack of Transportation (Non-Medical): No  Physical Activity: Insufficiently Active (05/01/2023)   Exercise Vital Sign    Days of Exercise per Week: 3 days    Minutes of Exercise per Session: 30 min  Stress: Stress Concern Present (05/01/2023)   [employer] of Occupational Health - Occupational Stress Questionnaire    Feeling of Stress : To some extent  Social Connections: Socially Integrated (05/01/2023)   Social Connection and Isolation Panel [NHANES]    Frequency of Communication with Friends and Family: More than three times a week    Frequency of Social Gatherings with Friends and Family: More than three times a week    Attends Religious Services: More than 4 times per year    Active Member of [employer] or Organizations: Yes    Attends Engineer, structural: More than 4 times per year    Marital Status: Married  Catering manager Violence: Not At Risk (11/02/2022)   Humiliation, Afraid, Rape, and Kick questionnaire    Fear of Current or Ex-Partner: No    Emotionally Abused: No    Physically Abused: No    Sexually Abused: No    Outpatient Medications Prior to Visit  Medication Sig Dispense Refill   Cholecalciferol (VITAMIN D3) 3000 UNITS TABS Take 1 tablet by mouth daily. 30 tablet    escitalopram (LEXAPRO) 5 MG tablet Take 1 tablet (5 mg total) by mouth daily. 90 tablet 1   valsartan  (DIOVAN) 80 MG tablet TAKE 1 TABLET BY MOUTH DAILY 90 tablet 3   No facility-administered medications prior to visit.    No Known Allergies  ROS    See HPI Objective:    Physical Exam Constitutional:      General: She is not in acute distress.    Appearance: Normal appearance. She is well-developed.  HENT:     Head: Normocephalic and atraumatic.     Right Ear: External ear normal.  Left Ear: External ear normal.  Eyes:     General: No scleral icterus. Neck:     Thyroid: No thyromegaly.  Cardiovascular:     Rate and Rhythm: Normal rate and regular rhythm.     Heart sounds: Normal heart sounds. No murmur heard. Pulmonary:     Effort: Pulmonary effort is normal. No respiratory distress.     Breath sounds: Normal breath sounds. No wheezing.  Musculoskeletal:     Cervical back: Neck supple.  Skin:    General: Skin is warm and dry.  Neurological:     Mental Status: She is alert and oriented to person, place, and time.  Psychiatric:        Mood and Affect: Mood normal.        Behavior: Behavior normal.        Thought Content: Thought content normal.        Judgment: Judgment normal.      BP 136/71 (BP Location: Right Arm, Patient Position: Sitting, Cuff Size: Normal)   Pulse 75   Temp 98.4 F (36.9 C) (Oral)   Resp 16   Ht 5\' 2"  (1.575 m)   Wt 143 lb (64.9 kg)   LMP 01/05/2005   SpO2 98%   BMI 26.16 kg/m  Wt Readings from Last 3 Encounters:  05/16/23 143 lb (64.9 kg)  11/02/22 145 lb (65.8 kg)  10/13/22 145 lb (65.8 kg)       Assessment & Plan:   Problem List Items Addressed This Visit       Unprioritized   Vitamin D deficiency    Continue vit D 3000 international units daily.       Relevant Orders   Vitamin D (25 hydroxy)   Hyperlipidemia    Update lipid panel.       Relevant Medications   triamterene-hydrochlorothiazide (MAXZIDE-25) 37.5-25 MG tablet   Other Relevant Orders   Lipid panel   Comp Met (CMET)   HTN (hypertension)    BP  stable.  OK to remain off of diovan and instead continue triamterene- hydrochlorothiazide.       Relevant Medications   triamterene-hydrochlorothiazide (MAXZIDE-25) 37.5-25 MG tablet   Anxiety - Primary    Stable without lexapro. Monitor.       Other Visit Diagnoses     Screening for colon cancer       Relevant Orders   Ambulatory referral to Gastroenterology   Encounter for hepatitis C screening test for low risk patient       Relevant Orders   Hepatitis C Antibody       I have discontinued Frannie Miklos's valsartan and escitalopram. I am also having her start on triamterene-hydrochlorothiazide and potassium chloride SA. Additionally, I am having her maintain her Vitamin D3.  Meds ordered this encounter  Medications   triamterene-hydrochlorothiazide (MAXZIDE-25) 37.5-25 MG tablet    Sig: Take 1 tablet by mouth daily.    Order Specific Question:   Supervising Provider    Answer:   Danise Edge A [[id number]]   potassium chloride SA (KLOR-CON M) 20 MEQ tablet    Sig: Take 1 tablet (20 mEq total) by mouth daily.    Order Specific Question:   Supervising Provider    Answer:   Danise Edge A [[id number]]

## 2023-05-16 NOTE — Assessment & Plan Note (Signed)
Stable without lexapro. Monitor.

## 2023-05-16 NOTE — Assessment & Plan Note (Signed)
Dexa up to date, add caltrate 600mg  + D bid. Continue regular weight bearing exercise.

## 2023-05-16 NOTE — Assessment & Plan Note (Signed)
Update lipid panel.  

## 2023-05-16 NOTE — Assessment & Plan Note (Signed)
BP stable.  OK to remain off of diovan and instead continue triamterene- hydrochlorothiazide.

## 2023-05-17 LAB — HEPATITIS C ANTIBODY: Hepatitis C Ab: NONREACTIVE

## 2023-06-05 ENCOUNTER — Encounter: Payer: Self-pay | Admitting: Gastroenterology

## 2023-06-07 ENCOUNTER — Other Ambulatory Visit: Payer: Self-pay

## 2023-06-07 ENCOUNTER — Encounter: Payer: Self-pay | Admitting: Family

## 2023-06-07 MED ORDER — TRIAMTERENE-HCTZ 37.5-25 MG PO TABS: 1.0000 | ORAL_TABLET | Freq: Every day | ORAL | 1 refills | Status: DC

## 2023-06-07 MED ORDER — POTASSIUM CHLORIDE CRYS ER 20 MEQ PO TBCR: 20.0000 meq | EXTENDED_RELEASE_TABLET | Freq: Every day | ORAL | 1 refills | Status: DC

## 2023-06-08 MED ORDER — TRIAMTERENE-HCTZ 37.5-25 MG PO TABS: 1.0000 | ORAL_TABLET | Freq: Every day | ORAL | 1 refills | Status: DC

## 2023-06-08 MED ORDER — POTASSIUM CHLORIDE CRYS ER 20 MEQ PO TBCR: 20.0000 meq | EXTENDED_RELEASE_TABLET | Freq: Every day | ORAL | 1 refills | Status: DC

## 2023-06-12 DIAGNOSIS — H43812 Vitreous degeneration, left eye: Secondary | ICD-10-CM | POA: Diagnosis not present

## 2023-08-12 ENCOUNTER — Other Ambulatory Visit: Payer: Self-pay | Admitting: Family

## 2023-08-14 DIAGNOSIS — H16223 Keratoconjunctivitis sicca, not specified as Sjogren's, bilateral: Secondary | ICD-10-CM | POA: Diagnosis not present

## 2023-08-24 ENCOUNTER — Ambulatory Visit: Payer: Medicare Other

## 2023-08-24 VITALS — Ht 62.0 in | Wt 143.0 lb

## 2023-08-24 DIAGNOSIS — Z800 Family history of malignant neoplasm of digestive organs: Secondary | ICD-10-CM

## 2023-08-24 DIAGNOSIS — Z860100 Personal history of colon polyps, unspecified: Secondary | ICD-10-CM

## 2023-08-24 MED ORDER — NA SULFATE-K SULFATE-MG SULF 17.5-3.13-1.6 GM/177ML PO SOLN: 1.0000 | Freq: Once | ORAL | 0 refills | Status: AC

## 2023-08-24 NOTE — Progress Notes (Signed)

## 2023-08-28 ENCOUNTER — Encounter: Payer: Self-pay | Admitting: Gastroenterology

## 2023-08-31 ENCOUNTER — Ambulatory Visit (AMBULATORY_SURGERY_CENTER): Payer: Medicare Other | Admitting: Gastroenterology

## 2023-08-31 ENCOUNTER — Encounter: Payer: Self-pay | Admitting: Gastroenterology

## 2023-08-31 VITALS — BP 97/76 | HR 79 | Temp 98.2°F | Resp 18 | Ht 62.0 in | Wt 143.0 lb

## 2023-08-31 DIAGNOSIS — K640 First degree hemorrhoids: Secondary | ICD-10-CM | POA: Diagnosis not present

## 2023-08-31 DIAGNOSIS — K5730 Diverticulosis of large intestine without perforation or abscess without bleeding: Secondary | ICD-10-CM | POA: Diagnosis not present

## 2023-08-31 DIAGNOSIS — Z1211 Encounter for screening for malignant neoplasm of colon: Secondary | ICD-10-CM

## 2023-08-31 DIAGNOSIS — D120 Benign neoplasm of cecum: Secondary | ICD-10-CM

## 2023-08-31 DIAGNOSIS — Z800 Family history of malignant neoplasm of digestive organs: Secondary | ICD-10-CM

## 2023-08-31 DIAGNOSIS — Z860100 Personal history of colon polyps, unspecified: Secondary | ICD-10-CM | POA: Diagnosis not present

## 2023-08-31 DIAGNOSIS — I10 Essential (primary) hypertension: Secondary | ICD-10-CM | POA: Diagnosis not present

## 2023-08-31 MED ORDER — SODIUM CHLORIDE 0.9 % IV SOLN: 500.0000 mL | Freq: Once | INTRAVENOUS | Status: DC

## 2023-08-31 NOTE — Patient Instructions (Signed)
 Thank you for letting us take care of your healthcare needs today. Please see handouts given to you on Polyps, Diverticulosis and Hemorrhoids.     YOU HAD AN ENDOSCOPIC PROCEDURE TODAY AT THE [facility]:   Refer to the procedure report that was given to you for any specific questions about what was found during the examination.  If the procedure report does not answer your questions, please call your gastroenterologist to clarify.  If you requested that your care partner not be given the details of your procedure findings, then the procedure report has been included in a sealed envelope for you to review at your convenience later.  YOU SHOULD EXPECT: Some feelings of bloating in the abdomen. Passage of more gas than usual.  Walking can help get rid of the air that was put into your GI tract during the procedure and reduce the bloating. If you had a lower endoscopy (such as a colonoscopy or flexible sigmoidoscopy) you may notice spotting of blood in your stool or on the toilet paper. If you underwent a bowel prep for your procedure, you may not have a normal bowel movement for a few days.  Please Note:  You might notice some irritation and congestion in your nose or some drainage.  This is from the oxygen used during your procedure.  There is no need for concern and it should clear up in a day or so.  SYMPTOMS TO REPORT IMMEDIATELY:  Following lower endoscopy (colonoscopy or flexible sigmoidoscopy):  Excessive amounts of blood in the stool  Significant tenderness or worsening of abdominal pains  Swelling of the abdomen that is new, acute  Fever of 100F or higher   For urgent or emergent issues, a gastroenterologist can be reached at any hour by calling [phone]. Do not use MyChart messaging for urgent concerns.    DIET:  We do recommend a small meal at first, but then you may proceed to your regular diet.  Drink plenty of fluids but you should avoid alcoholic beverages  for 24 hours.  ACTIVITY:  You should plan to take it easy for the rest of today and you should NOT DRIVE or use heavy machinery until tomorrow (because of the sedation medicines used during the test).    FOLLOW UP: Our staff will call the number listed on your records the next business day following your procedure.  We will call around 7:15- 8:00 am to check on you and address any questions or concerns that you may have regarding the information given to you following your procedure. If we do not reach you, we will leave a message.     If any biopsies were taken you will be contacted by phone or by letter within the next 1-3 weeks.  Please call us at [phone] if you have not heard about the biopsies in 3 weeks.    SIGNATURES/CONFIDENTIALITY: You and/or your care partner have signed paperwork which will be entered into your electronic medical record.  These signatures attest to the fact that that the information above on your After Visit Summary has been reviewed and is understood.  Full responsibility of the confidentiality of this discharge information lies with you and/or your care-partner.

## 2023-08-31 NOTE — Progress Notes (Signed)
 Sedate, gd SR, tolerated procedure well, VSS, report to RN

## 2023-08-31 NOTE — Progress Notes (Signed)
 [facility] History and Physical   Primary Care Physician:  Sandford Craze, NP   Reason for Procedure:   H/O polyps feb 2022  Plan:    colon     HPI: Jeanette Petty is a 68 y.o. female    Past Medical History:  Diagnosis Date   Acute bronchitis 04/11/2015   Anxiety November 2020   Insomnia--father ill and now in Hospice.  Continued now with aging mother and daughter's infertility issues.   GERD (gastroesophageal reflux disease)    PRN Tums    Hyperlipidemia 01/11/2015   Hypertension    Osteopenia    Vitamin D deficiency 01/29/2015    Past Surgical History:  Procedure Laterality Date   ECTOPIC PREGNANCY SURGERY  1985    Prior to Admission medications   Medication Sig Start Date End Date Taking? Authorizing Provider  Cholecalciferol (VITAMIN D3) 3000 UNITS TABS Take 1 tablet by mouth daily. 04/23/15  Yes Sandford Craze, NP  potassium chloride SA (KLOR-CON M) 20 MEQ tablet TAKE 1 TABLET BY MOUTH DAILY 08/14/23  Yes Sandford Craze, NP  triamterene-hydrochlorothiazide (MAXZIDE-25) 37.5-25 MG tablet TAKE 1 TABLET BY MOUTH DAILY 08/14/23 02/10/24 Yes Sandford Craze, NP    Current Outpatient Medications  Medication Sig Dispense Refill   Cholecalciferol (VITAMIN D3) 3000 UNITS TABS Take 1 tablet by mouth daily. 30 tablet    potassium chloride SA (KLOR-CON M) 20 MEQ tablet TAKE 1 TABLET BY MOUTH DAILY 100 tablet 1   triamterene-hydrochlorothiazide (MAXZIDE-25) 37.5-25 MG tablet TAKE 1 TABLET BY MOUTH DAILY 100 tablet 1   Current Facility-Administered Medications  Medication Dose Route Frequency Provider Last Rate Last Admin   0.9 %  sodium chloride infusion  500 mL Intravenous Once Lynann Bologna, MD        Allergies as of 08/31/2023   (No Known Allergies)    Family History  Problem Relation Age of Onset   Osteoarthritis Mother    Breast cancer Mother 57   Arthritis Mother    Hypertension Father    Cancer Father        prostate    Hyperlipidemia Father    Osteoarthritis Father        died of covid-19 Sep 17, 2019   Prostate cancer Father    Colon cancer Maternal Grandfather    Cancer Maternal Grandfather 49       colon   Colon polyps Maternal Grandfather    Arthritis Maternal Grandfather    Esophageal cancer Neg Hx    Rectal cancer Neg Hx    Stomach cancer Neg Hx    Crohn's disease Neg Hx     Social History   Socioeconomic History   Marital status: Married    Spouse name: Not on file   Number of children: Not on file   Years of education: Not on file   Highest education level: Doctorate  Occupational History   Not on file  Tobacco Use   Smoking status: Former    Current packs/day: 0.00    Average packs/day: 1 pack/day for 4.0 years (4.0 ttl pk-yrs)    Types: Cigarettes    Start date: 07/03/1982    Quit date: 07/03/1986    Years since quitting: 37.1   Smokeless tobacco: Never  Vaping Use   Vaping status: Never Used  Substance and Sexual Activity   Alcohol use: Yes    Alcohol/week: 3.0 standard drinks of alcohol    Types: 3 Glasses of wine per week    Comment: has 2 drinks on weekend  Drug use: No    Frequency: 1.0 times per week   Sexual activity: Not Currently    Partners: Male  Other Topics Concern   Not on file  Social History Narrative   Married   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 (lives in [place])   1610- Tobi Bastos (1991)- lives will   Grew up in [place]   Social Drivers of [employer]: Low Risk  (05/01/2023)   Overall Financial Resource Strain (CARDIA)    Difficulty of Paying Living Expenses: Not hard at all  Food Insecurity: No Food Insecurity (05/01/2023)   Hunger Vital Sign    Worried About Running Out of Food in the Last Year: Never true    Ran Out of Food in the Last Year: Never true  Transportation Needs: No Transportation Needs (05/01/2023)   PRAPARE -  Administrator, Civil Service (Medical): No    Lack of Transportation (Non-Medical): No  Physical Activity: Insufficiently Active (05/01/2023)   Exercise Vital Sign    Days of Exercise per Week: 3 days    Minutes of Exercise per Session: 30 min  Stress: Stress Concern Present (05/01/2023)   [employer] of Occupational Health - Occupational Stress Questionnaire    Feeling of Stress : To some extent  Social Connections: Socially Integrated (05/01/2023)   Social Connection and Isolation Panel [NHANES]    Frequency of Communication with Friends and Family: More than three times a week    Frequency of Social Gatherings with Friends and Family: More than three times a week    Attends Religious Services: More than 4 times per year    Active Member of [employer] or Organizations: Yes    Attends Engineer, structural: More than 4 times per year    Marital Status: Married  Catering manager Violence: Not At Risk (11/02/2022)   Humiliation, Afraid, Rape, and Kick questionnaire    Fear of Current or Ex-Partner: No    Emotionally Abused: No    Physically Abused: No    Sexually Abused: No    Review of Systems: Positive for none All other review of systems negative except as mentioned in the HPI.  Physical Exam: Vital signs in last 24 hours: @VSRANGES @   General:   Alert,  Well-developed, well-nourished, pleasant and cooperative in NAD Lungs:  Clear throughout to auscultation.   Heart:  Regular rate and rhythm; no murmurs, clicks, rubs,  or gallops. Abdomen:  Soft, nontender and nondistended. Normal bowel sounds.   Neuro/Psych:  Alert and cooperative. Normal mood and affect. A and O x 3    No significant changes were identified.  The patient continues to be an appropriate candidate for the planned procedure and anesthesia.   Edman Circle, MD. [facility] 08/31/2023 10:35 AM@

## 2023-08-31 NOTE — Progress Notes (Signed)
 Called to room to assist during endoscopic procedure.  Patient ID and intended procedure confirmed with present staff. Received instructions for my participation in the procedure from the performing physician.

## 2023-08-31 NOTE — Op Note (Signed)
 [facility] Patient Name: Jeanette Petty Procedure Date: 08/31/2023 10:31 AM MRN: [id number] Endoscopist: Lynann Bologna , MD, [id number] Age: 68 Referring MD:  Date of Birth: 1955/08/15 Gender: Female Account #: [id number] Procedure:                Colonoscopy Indications:              High risk colon cancer surveillance: Personal                            history of colonic polyps 08/2020 Medicines:                Monitored Anesthesia Care Procedure:                Pre-Anesthesia Assessment:                           - Prior to the procedure, a History and Physical                            was performed, and patient medications and                            allergies were reviewed. The patient's tolerance of                            previous anesthesia was also reviewed. The risks                            and benefits of the procedure and the sedation                            options and risks were discussed with the patient.                            All questions were answered, and informed consent                            was obtained. Prior Anticoagulants: The patient has                            taken no anticoagulant or antiplatelet agents. ASA                            Grade Assessment: II - A patient with mild systemic                            disease. After reviewing the risks and benefits,                            the patient was deemed in satisfactory condition to                            undergo the procedure.  After obtaining informed consent, the colonoscope                            was passed under direct vision. Throughout the                            procedure, the patient's blood pressure, pulse, and                            oxygen saturations were monitored continuously. The                            Olympus Scope [id number] was introduced through the                            anus and advanced to the the  cecum, identified by                            appendiceal orifice and ileocecal valve. The                            colonoscopy was performed without difficulty. The                            patient tolerated the procedure well. The quality                            of the bowel preparation was good. The ileocecal                            valve, appendiceal orifice, and rectum were                            photographed. Scope In: 10:46:21 AM Scope Out: 10:56:57 AM Scope Withdrawal Time: 0 hours 8 minutes 4 seconds  Total Procedure Duration: 0 hours 10 minutes 36 seconds  Findings:                 A 4 mm polyp was found in the cecum. The polyp was                            sessile. The polyp was removed with a cold snare.                            Resection and retrieval were complete.                           A few medium-mouthed diverticula were found in the                            sigmoid colon.                           Non-bleeding internal hemorrhoids were found during  retroflexion. The hemorrhoids were small and Grade                            I (internal hemorrhoids that do not prolapse).                           The exam was otherwise without abnormality on                            direct and retroflexion views. Complications:            No immediate complications. Estimated Blood Loss:     Estimated blood loss: none. Impression:               - One 4 mm polyp in the cecum, removed with a cold                            snare. Resected and retrieved.                           - Mild sigmoid diverticulosis.                           - Non-bleeding internal hemorrhoids.                           - The examination was otherwise normal on direct                            and retroflexion views. Recommendation:           - Patient has a contact number available for                            emergencies. The signs and symptoms of  potential                            delayed complications were discussed with the                            patient. Return to normal activities tomorrow.                            Written discharge instructions were provided to the                            patient.                           - Resume previous diet.                           - Continue present medications.                           - Await pathology results.                           -  Repeat colonoscopy for surveillance based on                            pathology results.                           - The findings and recommendations were discussed                            with the patient's family. Lynann Bologna, MD 08/31/2023 11:00:11 AM This report has been signed electronically.

## 2023-08-31 NOTE — Progress Notes (Signed)
 Pt's states no medical or surgical changes since previsit or office visit.

## 2023-09-03 ENCOUNTER — Telehealth: Payer: Self-pay | Admitting: *Deleted

## 2023-09-03 NOTE — Telephone Encounter (Signed)
  Follow up Call-     08/31/2023   10:02 AM  Call back number  Post procedure Call Back phone  # [phone]  Permission to leave phone message Yes     Patient questions:  Do you have a fever, pain , or abdominal swelling? No. Pain Score  0 *  Have you tolerated food without any problems? Yes.    Have you been able to return to your normal activities? Yes.    Do you have any questions about your discharge instructions: Diet   No. Medications  No. Follow up visit  No.  Do you have questions or concerns about your Care? No.  Actions: * If pain score is 4 or above: No action needed, pain <4.

## 2023-09-04 LAB — SURGICAL PATHOLOGY

## 2023-09-05 ENCOUNTER — Encounter: Payer: Self-pay | Admitting: Family

## 2023-09-05 ENCOUNTER — Encounter: Payer: Self-pay | Admitting: Gastroenterology

## 2023-09-14 ENCOUNTER — Ambulatory Visit (INDEPENDENT_AMBULATORY_CARE_PROVIDER_SITE_OTHER): Admitting: Physician Assistant

## 2023-09-14 VITALS — BP 132/81 | HR 77 | Temp 98.7°F | Resp 16 | Ht 62.0 in | Wt 143.0 lb

## 2023-09-14 DIAGNOSIS — E010 Iodine-deficiency related diffuse (endemic) goiter: Secondary | ICD-10-CM

## 2023-09-14 NOTE — Progress Notes (Signed)
      Established patient visit   Patient: Jeanette Petty   DOB: 1955/12/27   68 y.o. Female  MRN: [id number] Visit Date: 09/14/2023  Today's healthcare provider: Alfredia Ferguson, PA-C   Cc. Neck mass x 1 mo  Subjective     Pt reports a lump in the middle of her neck x 1 mo, denies difficulty or pain while swallowing, but sometimes pain in her L ear that radiates down her neck.   She also reports dry skin which is unusual for her.   Medications: Outpatient Medications Prior to Visit  Medication Sig   Cholecalciferol (VITAMIN D3) 3000 UNITS TABS Take 1 tablet by mouth daily.   potassium chloride SA (KLOR-CON M) 20 MEQ tablet TAKE 1 TABLET BY MOUTH DAILY   triamterene-hydrochlorothiazide (MAXZIDE-25) 37.5-25 MG tablet TAKE 1 TABLET BY MOUTH DAILY   No facility-administered medications prior to visit.    Review of Systems  Constitutional:  Negative for fatigue and fever.  HENT:  Positive for ear pain.   Respiratory:  Negative for cough and shortness of breath.   Cardiovascular:  Negative for chest pain and leg swelling.  Gastrointestinal:  Negative for abdominal pain.  Neurological:  Negative for dizziness and headaches.       Objective    BP 132/81 (BP Location: Right Arm, Patient Position: Sitting, Cuff Size: Small)   Pulse 77   Temp 98.7 F (37.1 C) (Oral)   Resp 16   Ht 5\' 2"  (1.575 m)   Wt 143 lb (64.9 kg)   LMP 01/05/2005   SpO2 97%   BMI 26.16 kg/m    Physical Exam Vitals reviewed.  Constitutional:      Appearance: She is not ill-appearing.  HENT:     Head: Normocephalic.     Left Ear: There is impacted cerumen.  Eyes:     Conjunctiva/sclera: Conjunctivae normal.  Neck:     Thyroid: Thyromegaly present.     Comments: Difficult to appreciate if just thyromegaly, L side of thyroid does seem larger  Cardiovascular:     Rate and Rhythm: Normal rate.  Pulmonary:     Effort: Pulmonary effort is normal. No respiratory distress.  Lymphadenopathy:      Cervical: No cervical adenopathy.  Neurological:     Mental Status: She is alert and oriented to person, place, and time.  Psychiatric:        Mood and Affect: Mood normal.        Behavior: Behavior normal.      No results found for any visits on 09/14/23.  Assessment & Plan    Thyromegaly -     TSH -     T4, free -     Comprehensive metabolic panel -     CBC with Differential/Platelet -     US THYROID   Recommending labs, US thyroid.  Return if symptoms worsen or fail to improve.      Alfredia Ferguson, PA-C  [facility] [phone] (phone) [phone] (fax)  [facility]

## 2023-09-15 LAB — CBC WITH DIFFERENTIAL/PLATELET
Basophils Absolute: 0.1 10*3/uL (ref 0.0–0.2)
Basos: 1 %
EOS (ABSOLUTE): 0.1 10*3/uL (ref 0.0–0.4)
Eos: 1 %
Hematocrit: 46 % (ref 34.0–46.6)
Hemoglobin: 15.1 g/dL (ref 11.1–15.9)
Immature Grans (Abs): 0 10*3/uL (ref 0.0–0.1)
Immature Granulocytes: 0 %
Lymphocytes Absolute: 1.9 10*3/uL (ref 0.7–3.1)
Lymphs: 19 %
MCH: 31.5 pg (ref 26.6–33.0)
MCHC: 32.8 g/dL (ref 31.5–35.7)
MCV: 96 fL (ref 79–97)
Monocytes Absolute: 0.6 10*3/uL (ref 0.1–0.9)
Monocytes: 6 %
Neutrophils Absolute: 7 10*3/uL (ref 1.4–7.0)
Neutrophils: 73 %
Platelets: 311 10*3/uL (ref 150–450)
RBC: 4.8 x10E6/uL (ref 3.77–5.28)
RDW: 13.1 % (ref 11.7–15.4)
WBC: 9.6 10*3/uL (ref 3.4–10.8)

## 2023-09-15 LAB — COMPREHENSIVE METABOLIC PANEL
ALT: 26 IU/L (ref 0–32)
AST: 25 IU/L (ref 0–40)
Albumin: 4.6 g/dL (ref 3.9–4.9)
Alkaline Phosphatase: 66 IU/L (ref 44–121)
BUN/Creatinine Ratio: 12 (ref 12–28)
BUN: 10 mg/dL (ref 8–27)
Bilirubin Total: 0.5 mg/dL (ref 0.0–1.2)
CO2: 23 mmol/L (ref 20–29)
Calcium: 10.2 mg/dL (ref 8.7–10.3)
Chloride: 103 mmol/L (ref 96–106)
Creatinine, Ser: 0.81 mg/dL (ref 0.57–1.00)
Globulin, Total: 2.6 g/dL (ref 1.5–4.5)
Glucose: 91 mg/dL (ref 70–99)
Potassium: 4.9 mmol/L (ref 3.5–5.2)
Sodium: 142 mmol/L (ref 134–144)
Total Protein: 7.2 g/dL (ref 6.0–8.5)
eGFR: 79 mL/min/{1.73_m2} (ref >59)

## 2023-09-15 LAB — T4, FREE: Free T4: 1.39 ng/dL (ref 0.82–1.77)

## 2023-09-15 LAB — TSH: TSH: 0.531 u[IU]/mL (ref 0.450–4.500)

## 2023-09-17 ENCOUNTER — Encounter: Payer: Self-pay | Admitting: Physician Assistant

## 2023-09-18 ENCOUNTER — Ambulatory Visit

## 2023-09-18 DIAGNOSIS — E042 Nontoxic multinodular goiter: Secondary | ICD-10-CM | POA: Diagnosis not present

## 2023-09-21 ENCOUNTER — Encounter: Payer: Self-pay | Admitting: Physician Assistant

## 2023-09-21 ENCOUNTER — Other Ambulatory Visit: Payer: Self-pay | Admitting: Physician Assistant

## 2023-09-21 ENCOUNTER — Ambulatory Visit: Admitting: Family

## 2023-09-21 DIAGNOSIS — E041 Nontoxic single thyroid nodule: Secondary | ICD-10-CM

## 2023-09-24 ENCOUNTER — Telehealth (HOSPITAL_BASED_OUTPATIENT_CLINIC_OR_DEPARTMENT_OTHER): Payer: Self-pay

## 2023-11-13 ENCOUNTER — Ambulatory Visit (INDEPENDENT_AMBULATORY_CARE_PROVIDER_SITE_OTHER)

## 2023-11-13 VITALS — Ht 62.0 in | Wt 143.0 lb

## 2023-11-13 DIAGNOSIS — Z0000 Encounter for general adult medical examination without abnormal findings: Secondary | ICD-10-CM | POA: Diagnosis not present

## 2023-11-13 NOTE — Progress Notes (Signed)
 Subjective:   Jeanette Petty is a 68 y.o. who presents for a Medicare Wellness preventive visit.  As a reminder, Annual Wellness Visits don't include a physical exam, and some assessments may be limited, especially if this visit is performed virtually. We may recommend an in-person visit if needed.  Visit Complete: Virtual I connected with  Jeanette Petty on 11/13/23 by a audio enabled telemedicine application and verified that I am speaking with the correct person using two identifiers.  Patient Location: Home  Provider Location: Home Office  I discussed the limitations of evaluation and management by telemedicine. The patient expressed understanding and agreed to proceed.  Vital Signs: Because this visit was a virtual/telehealth visit, some criteria may be missing or patient reported. Any vitals not documented were not able to be obtained and vitals that have been documented are patient reported.    Persons Participating in Visit: Patient.  AWV Questionnaire: No: Patient Medicare AWV questionnaire was not completed prior to this visit.  Cardiac Risk Factors include: advanced age (>24men, >18 women);hypertension     Objective:     Today's Vitals   11/13/23 1013  Weight: 143 lb (64.9 kg)  Height: 5\' 2"  (1.575 m)   Body mass index is 26.16 kg/m.     11/13/2023   10:19 AM 11/02/2022    9:08 AM 10/31/2021   10:22 AM  Advanced Directives  Does Patient Have a Medical Advance Directive? Yes Yes Yes  Type of Estate agent of [place];Living will Healthcare Power of [place];Living will Healthcare Power of [place];Out of facility DNR (pink MOST or yellow form);Living will  Copy of Healthcare Power of Attorney in Chart? No - copy requested No - copy requested No - copy requested    Current Medications (verified) Outpatient Encounter Medications as of 11/13/2023  Medication Sig   Cholecalciferol (VITAMIN D3) 3000 UNITS TABS Take 1 tablet by mouth daily.    potassium chloride  SA (KLOR-CON  M) 20 MEQ tablet TAKE 1 TABLET BY MOUTH DAILY   triamterene -hydrochlorothiazide (MAXZIDE-25) 37.5-25 MG tablet TAKE 1 TABLET BY MOUTH DAILY   No facility-administered encounter medications on file as of 11/13/2023.    Allergies (verified) Patient has no known allergies.   History: Past Medical History:  Diagnosis Date   Acute bronchitis 04/11/2015   Anxiety November 2020   Insomnia--father ill and now in Hospice.  Continued now with aging mother and daughter's infertility issues.   GERD (gastroesophageal reflux disease)    PRN Tums    Hyperlipidemia 01/11/2015   Hypertension    Osteopenia    Vitamin D  deficiency 01/29/2015   Past Surgical History:  Procedure Laterality Date   ECTOPIC PREGNANCY SURGERY  1985   Family History  Problem Relation Age of Onset   Osteoarthritis Mother    Breast cancer Mother 63   Arthritis Mother    Hypertension Father    Cancer Father        prostate   Hyperlipidemia Father    Osteoarthritis Father        died of covid-19 11/25/2019   Prostate cancer Father    Colon cancer Maternal Grandfather    Cancer Maternal Grandfather 46       colon   Colon polyps Maternal Grandfather    Arthritis Maternal Grandfather    Esophageal cancer Neg Hx    Rectal cancer Neg Hx    Stomach cancer Neg Hx    Crohn's disease Neg Hx    Social History   Socioeconomic History  Marital status: Married    Spouse name: Not on file   Number of children: Not on file   Years of education: Not on file   Highest education level: Doctorate  Occupational History   Not on file  Tobacco Use   Smoking status: Former    Current packs/day: 0.00    Average packs/day: 1 pack/day for 4.0 years (4.0 ttl pk-yrs)    Types: Cigarettes    Start date: 07/03/1982    Quit date: 07/03/1986    Years since quitting: 37.3   Smokeless tobacco: Never  Vaping Use   Vaping status: Never Used  Substance and Sexual Activity   Alcohol use: Yes    Alcohol/week:  3.0 standard drinks of alcohol    Types: 3 Glasses of wine per week    Comment: has 2 drinks on weekend   Drug use: No    Frequency: 1.0 times per week   Sexual activity: Not Currently    Partners: Male  Other Topics Concern   Not on file  Social History Narrative   Married   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 (lives in [place])   0981- Jeanette Petty (1991)- lives will   Grew up in [place]   Social Drivers of [employer]: Low Risk  (11/13/2023)   Overall Financial Resource Strain (CARDIA)    Difficulty of Paying Living Expenses: Not hard at all  Food Insecurity: No Food Insecurity (11/13/2023)   Hunger Vital Sign    Worried About Running Out of Food in the Last Year: Never true    Ran Out of Food in the Last Year: Never true  Transportation Needs: No Transportation Needs (09/13/2023)   PRAPARE - Administrator, Civil Service (Medical): No    Lack of Transportation (Non-Medical): No  Physical Activity: Insufficiently Active (11/13/2023)   Exercise Vital Sign    Days of Exercise per Week: 3 days    Minutes of Exercise per Session: 30 min  Stress: No Stress Concern Present (11/13/2023)   [employer] of Occupational Health - Occupational Stress Questionnaire    Feeling of Stress : Not at all  Recent Concern: Stress - Stress Concern Present (09/13/2023)   [employer] of Occupational Health - Occupational Stress Questionnaire    Feeling of Stress : To some extent  Social Connections: Socially Integrated (11/13/2023)   Social Connection and Isolation Panel [NHANES]    Frequency of Communication with Friends and Family: More than three times a week    Frequency of Social Gatherings with Friends and Family: More than three times a week    Attends Religious Services: More than 4 times per year    Active Member of [employer] or Organizations: Yes    Attends Probation officer: More than 4 times per year    Marital Status: Married    Tobacco Counseling Counseling given: Not Answered    Clinical Intake:  Pre-visit preparation completed: Yes  Pain : No/denies pain     BMI - recorded: 26.16 Nutritional Status: BMI 25 -29 Overweight Nutritional Risks: None Diabetes: No  No results found for: "HGBA1C"   How often do you need to have someone help you when you read instructions, pamphlets, or other written materials from your doctor or pharmacy?: 1 - Never  Interpreter Needed?: No  Information entered by :: Farris Hong LPN  Activities of Daily Living     11/13/2023   10:18 AM  In your present state of health, do you have any difficulty performing the following activities:  Hearing? 0  Vision? 0  Difficulty concentrating or making decisions? 0  Walking or climbing stairs? 0  Dressing or bathing? 0  Doing errands, shopping? 0  Preparing Food and eating ? N  Using the Toilet? N  In the past six months, have you accidently leaked urine? N  Do you have problems with loss of bowel control? N  Managing your Medications? N  Managing your Finances? N  Housekeeping or managing your Housekeeping? N    Patient Care Team: Dorrene Gaucher, NP as PCP - General (Internal Medicine) Helena Loach, MD (Obstetrics and Gynecology)  Indicate any recent Medical Services you may have received from other than [facility] providers in the past year (date may be approximate).     Assessment:    This is a routine wellness examination for Jalin.  Hearing/Vision screen Hearing Screening - Comments:: Denies hearing difficulties   Vision Screening - Comments:: Wears rx glasses - up to date with routine eye exams with  [facility]   Goals Addressed               This Visit's Progress     Increase physical activity (pt-stated)        Lose weight       Depression Screen     11/13/2023   10:17 AM 11/02/2022    9:04 AM  09/26/2022   12:59 PM 10/31/2021   10:23 AM 09/12/2021    8:21 AM 09/10/2020   11:02 AM 07/29/2019    9:44 AM  PHQ 2/9 Scores  PHQ - 2 Score 0 0 0 0 0 0 0    Fall Risk     11/13/2023   10:18 AM 10/27/2022    9:54 PM 09/26/2022   12:59 PM 10/31/2021   10:23 AM 09/12/2021    8:22 AM  Fall Risk   Falls in the past year? 0 0 0 0 0  Number falls in past yr: 0 0 0 0 0  Injury with Fall? 0 0 0 0 0  Risk for fall due to : No Fall Risks No Fall Risks No Fall Risks No Fall Risks   Follow up Falls prevention discussed;Falls evaluation completed Falls evaluation completed Falls evaluation completed Falls evaluation completed     MEDICARE RISK AT HOME:  Medicare Risk at Home Any stairs in or around the home?: Yes If so, are there any without handrails?: No Home free of loose throw rugs in walkways, pet beds, electrical cords, etc?: Yes Adequate lighting in your home to reduce risk of falls?: Yes Life alert?: No Use of a cane, walker or w/c?: No Grab bars in the bathroom?: No Shower chair or bench in shower?: No Elevated toilet seat or a handicapped toilet?: No  TIMED UP AND GO:  Was the test performed?  No  Cognitive Function: 6CIT completed        11/13/2023   10:19 AM 11/02/2022    9:10 AM 10/31/2021   10:26 AM  6CIT Screen  What Year? 0 points 0 points 0 points  What month? 0 points 0 points 0 points  What time? 0 points 0 points 0 points  Count back from 20 0 points 0 points 0 points  Months in reverse 0 points 0 points 0 points  Repeat phrase 0 points 0 points 0  points  Total Score 0 points 0 points 0 points    Immunizations Immunization History  Administered Date(s) Administered   Influenza, High Dose Seasonal PF 03/03/2021   Influenza,inj,Quad PF,6+ Mos 04/21/2018   Influenza,inj,Quad PF,6-35 Mos 04/03/2019   Influenza-Unspecified 03/17/2022, 04/03/2023   PFIZER(Purple Top)SARS-COV-2 Vaccination 09/03/2019, 10/01/2019, 04/30/2020   PNEUMOCOCCAL CONJUGATE-20 09/12/2021    Pfizer Covid-19 Vaccine Bivalent Booster 18yrs & up 04/15/2021   Pfizer(Comirnaty)Fall Seasonal Vaccine 12 years and older 08/26/2022, 04/03/2023   Pneumococcal Polysaccharide-23 09/10/2020   Respiratory Syncytial Virus Vaccine,Recomb Aduvanted(Arexvy) 03/17/2022   Tdap 01/06/2015   Zoster Recombinant(Shingrix ) 07/29/2019, 10/21/2019    Screening Tests Health Maintenance  Topic Date Due   COVID-19 Vaccine (7 - 2024-25 season) 10/02/2023   INFLUENZA VACCINE  02/01/2024   Medicare Annual Wellness (AWV)  11/12/2024   MAMMOGRAM  12/04/2024   DTaP/Tdap/Td (2 - Td or Tdap) 01/05/2025   Colonoscopy  08/30/2030   Pneumonia Vaccine 21+ Years old  Completed   DEXA SCAN  Completed   Hepatitis C Screening  Completed   Zoster Vaccines- Shingrix   Completed   HPV VACCINES  Aged Out   Meningococcal B Vaccine  Aged Out    Health Maintenance  Health Maintenance Due  Topic Date Due   COVID-19 Vaccine (7 - 2024-25 season) 10/02/2023   Health Maintenance Items Addressed:   Additional Screening:  Vision Screening: Recommended annual ophthalmology exams for early detection of glaucoma and other disorders of the eye.  Dental Screening: Recommended annual dental exams for proper oral hygiene  Community Resource Referral / Chronic Care Management: CRR required this visit?  No   CCM required this visit?  No   Plan:    I have personally reviewed and noted the following in the patient's chart:   Medical and social history Use of alcohol, tobacco or illicit drugs  Current medications and supplements including opioid prescriptions. Patient is not currently taking opioid prescriptions. Functional ability and status Nutritional status Physical activity Advanced directives List of other physicians Hospitalizations, surgeries, and ER visits in previous 12 months Vitals Screenings to include cognitive, depression, and falls Referrals and appointments  In addition, I have reviewed and  discussed with patient certain preventive protocols, quality metrics, and best practice recommendations. A written personalized care plan for preventive services as well as general preventive health recommendations were provided to patient.   Dewayne Ford, LPN   09/07/6576   After Visit Summary: (MyChart) Due to this being a telephonic visit, the after visit summary with patients personalized plan was offered to patient via MyChart   Notes: Nothing significant to report at this time.

## 2023-11-13 NOTE — Patient Instructions (Addendum)
 Jeanette Petty , Thank you for taking time out of your busy schedule to complete your Annual Wellness Visit with me. I enjoyed our conversation and look forward to speaking with you again next year. I, as well as your care team,  appreciate your ongoing commitment to your health goals. Please review the following plan we discussed and let me know if I can assist you in the future. Your Game plan/ To Do List    Referrals: If you haven't heard from the office you've been referred to, please reach out to them at the phone provided.   Follow up Visits: Next Medicare AWV with our clinical staff: 09/20/24   Have you seen your provider in the last 6 months (3 months if uncontrolled diabetes)? Yes 09/21/23 Next Office Visit with your provider:   Clinician Recommendations:  Aim for 30 minutes of exercise or brisk walking, 6-8 glasses of water, and 5 servings of fruits and vegetables each day.       This is a list of the screening recommended for you and due dates:  Health Maintenance  Topic Date Due   COVID-19 Vaccine (7 - 2024-25 season) 10/02/2023   Flu Shot  02/01/2024   Medicare Annual Wellness Visit  11/12/2024   Mammogram  12/04/2024   DTaP/Tdap/Td vaccine (2 - Td or Tdap) 01/05/2025   Colon Cancer Screening  08/30/2030   Pneumonia Vaccine  Completed   DEXA scan (bone density measurement)  Completed   Hepatitis C Screening  Completed   Zoster (Shingles) Vaccine  Completed   HPV Vaccine  Aged Out   Meningitis B Vaccine  Aged Out    Advanced directives: (Copy Requested) Please bring a copy of your health care power of attorney and living will to the office to be added to your chart at your convenience. You can mail to [facility] [address] [place], [place] [address] or email to [email or web address] Advance Care Planning is important because it:  [x]  Makes sure you receive the medical care that is consistent with your values, goals, and preferences  [x]  It  provides guidance to your family and loved ones and reduces their decisional burden about whether or not they are making the right decisions based on your wishes.  Follow the link provided in your after visit summary or read over the paperwork we have mailed to you to help you started getting your Advance Directives in place. If you need assistance in completing these, please reach out to us  so that we can help you!  See attachments for Preventive Care and Fall Prevention Tips.

## 2023-12-17 DIAGNOSIS — Z1231 Encounter for screening mammogram for malignant neoplasm of breast: Secondary | ICD-10-CM | POA: Diagnosis not present

## 2023-12-17 LAB — HM MAMMOGRAPHY

## 2024-01-24 ENCOUNTER — Other Ambulatory Visit: Payer: Self-pay | Admitting: Family

## 2024-02-08 ENCOUNTER — Ambulatory Visit: Admitting: Family

## 2024-02-08 VITALS — BP 125/63 | HR 82 | Temp 99.0°F | Resp 16 | Ht 62.0 in | Wt 142.0 lb

## 2024-02-08 DIAGNOSIS — E042 Nontoxic multinodular goiter: Secondary | ICD-10-CM | POA: Diagnosis not present

## 2024-02-08 DIAGNOSIS — E876 Hypokalemia: Secondary | ICD-10-CM | POA: Diagnosis not present

## 2024-02-08 DIAGNOSIS — F419 Anxiety disorder, unspecified: Secondary | ICD-10-CM

## 2024-02-08 DIAGNOSIS — I10 Essential (primary) hypertension: Secondary | ICD-10-CM | POA: Diagnosis not present

## 2024-02-08 MED ORDER — ESCITALOPRAM OXALATE 5 MG PO TABS: 5.0000 mg | ORAL_TABLET | Freq: Every day | ORAL | 1 refills | Status: DC

## 2024-02-08 NOTE — Patient Instructions (Signed)
 VISIT SUMMARY:  You came in today to renew your sleep medication. We discussed your ongoing anxiety and sleep issues, and your current treatment plan. We also reviewed your thyroid  condition and planned follow-up care.  YOUR PLAN:  GENERALIZED ANXIETY DISORDER WITH INSOMNIA: You have anxiety and trouble sleeping, which have improved with your current medication, Lexapro . -Continue taking Lexapro  as prescribed. I have sent a 90-day supply to your pharmacy at [facility] on [address].  THYROID  NODULES: You have thyroid  nodules, and one of them needs follow-up imaging this coming January.

## 2024-02-08 NOTE — Assessment & Plan Note (Signed)
 Continues Kdur. Last potassium was normal.

## 2024-02-08 NOTE — Progress Notes (Signed)
 Subjective:     [name], female    DOB: September 23, 1955, 68 y.o.   MRN: [id number]  Chief Complaint  Patient presents with   Insomnia    Patient having trouble falling sleep, will like escitalopram  5 mg renewed.     Insomnia    Discussed the use of AI scribe software for clinical note transcription with the patient, who gave verbal consent to proceed.  History of Present Illness  Jeanette Petty is a 68 year old female who presents for a follow-up regarding her sleep issues and medication management.  She initially started medication for sleep issues related to anxiety, primarily due to stress from her mother's declining cognitive health. After a month of consistent use, she experienced significant improvement in sleep quality and seeks to renew her prescription as it benefits her sleep and anxiety.  Her mother, aged 22, has cognitive decline and mood swings, causing stress. Her mother was recently hospitalized for confusion, and there is family disagreement about her care and medication for depression and memory issues.  In January, an ultrasound identified four thyroid  nodules with one requiring follow-up in a year.   Lab work in March showed normal blood count, kidney function, and electrolytes. She continues her medications, including potassium supplements, and completed a colonoscopy in February. All other preventative screenings, including a mammogram, are up to date.    Health Maintenance Due  Topic Date Due   COVID-19 Vaccine (7 - 2024-25 season) 10/02/2023   INFLUENZA VACCINE  02/01/2024    Past Medical History:  Diagnosis Date   Acute bronchitis 04/11/2015   Anxiety November 2020   Insomnia--father ill and now in Hospice.  Continued now with aging mother and daughter's infertility issues.   GERD (gastroesophageal reflux disease)    PRN Tums    Hyperlipidemia 01/11/2015   Hypertension    Osteopenia    Vitamin D  deficiency 01/29/2015    Past  Surgical History:  Procedure Laterality Date   ECTOPIC PREGNANCY SURGERY  1985    Family History  Problem Relation Age of Onset   Osteoarthritis Mother    Breast cancer Mother 48   Arthritis Mother    Hypertension Father    Cancer Father        prostate   Hyperlipidemia Father    Osteoarthritis Father        died of covid-19 02-22-2020   Prostate cancer Father    Colon cancer Maternal Grandfather    Cancer Maternal Grandfather 48       colon   Colon polyps Maternal Grandfather    Arthritis Maternal Grandfather    Esophageal cancer Neg Hx    Rectal cancer Neg Hx    Stomach cancer Neg Hx    Crohn's disease Neg Hx     Social History   Socioeconomic History   Marital status: Married    Spouse name: Not on file   Number of children: Not on file   Years of education: Not on file   Highest education level: Doctorate  Occupational History   Not on file  Tobacco Use   Smoking status: Former    Current packs/day: 0.00    Average packs/day: 1 pack/day for 4.0 years (4.0 ttl pk-yrs)    Types: Cigarettes    Start date: 07/03/1982    Quit date: 07/03/1986    Years since quitting: 37.6   Smokeless tobacco: Never  Vaping Use   Vaping status: Never Used  Substance and Sexual Activity  Alcohol use: Yes    Alcohol/week: 3.0 standard drinks of alcohol    Types: 3 Glasses of wine per week    Comment: has 2 drinks on weekend   Drug use: No    Frequency: 1.0 times per week   Sexual activity: Not Currently    Partners: Male  Other Topics Concern   Not on file  Social History Narrative   Married   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 (lives in [place])   8008- Therisa (1991)- lives will   Grew up in [place]   Social Drivers of [employer]: Low Risk  (02/04/2024)   Overall Financial Resource Strain (CARDIA)    Difficulty of Paying Living Expenses: Not hard at all  Food  Insecurity: No Food Insecurity (02/04/2024)   Hunger Vital Sign    Worried About Running Out of Food in the Last Year: Never true    Ran Out of Food in the Last Year: Never true  Transportation Needs: No Transportation Needs (02/04/2024)   PRAPARE - Administrator, Civil Service (Medical): No    Lack of Transportation (Non-Medical): No  Physical Activity: Insufficiently Active (02/04/2024)   Exercise Vital Sign    Days of Exercise per Week: 3 days    Minutes of Exercise per Session: 30 min  Stress: Stress Concern Present (02/04/2024)   [employer] of Occupational Health - Occupational Stress Questionnaire    Feeling of Stress: To some extent  Social Connections: Socially Integrated (02/04/2024)   Social Connection and Isolation Panel    Frequency of Communication with Friends and Family: More than three times a week    Frequency of Social Gatherings with Friends and Family: More than three times a week    Attends Religious Services: More than 4 times per year    Active Member of [employer] or Organizations: Yes    Attends Banker Meetings: Not on file    Marital Status: Married  Intimate Partner Violence: Not At Risk (11/13/2023)   Humiliation, Afraid, Rape, and Kick questionnaire    Fear of Current or Ex-Partner: No    Emotionally Abused: No    Physically Abused: No    Sexually Abused: No    Outpatient Medications Prior to Visit  Medication Sig Dispense Refill   Cholecalciferol (VITAMIN D3) 3000 UNITS TABS Take 1 tablet by mouth daily. 30 tablet    potassium chloride  SA (KLOR-CON  M) 20 MEQ tablet TAKE 1 TABLET BY MOUTH DAILY 100 tablet 2   triamterene -hydrochlorothiazide (MAXZIDE-25) 37.5-25 MG tablet TAKE 1 TABLET BY MOUTH DAILY 100 tablet 2   No facility-administered medications prior to visit.    No Known Allergies  Review of Systems  Psychiatric/Behavioral:  The patient has insomnia.        Objective:    Physical Exam Constitutional:      General:  She is not in acute distress.    Appearance: Normal appearance. She is well-developed.  HENT:     Head: Normocephalic and atraumatic.     Right Ear: External ear normal.     Left Ear: External ear normal.  Eyes:     General: No scleral icterus. Neck:     Thyroid : Thyromegaly (mild) present.  Cardiovascular:     Rate and Rhythm: Normal rate and regular rhythm.     Heart sounds: Normal heart sounds. No murmur  heard. Pulmonary:     Effort: Pulmonary effort is normal. No respiratory distress.     Breath sounds: Normal breath sounds. No wheezing.  Musculoskeletal:     Cervical back: Neck supple.  Skin:    General: Skin is warm and dry.  Neurological:     Mental Status: She is alert and oriented to person, place, and time.  Psychiatric:        Mood and Affect: Mood normal.        Behavior: Behavior normal.        Thought Content: Thought content normal.        Judgment: Judgment normal.      BP 125/63 (BP Location: Right Arm, Patient Position: Sitting, Cuff Size: Normal)   Pulse 82   Temp 99 F (37.2 C) (Oral)   Resp 16   Ht 5' 2 (1.575 m)   Wt 142 lb (64.4 kg)   LMP 01/05/2005   SpO2 97%   BMI 25.97 kg/m  Wt Readings from Last 3 Encounters:  02/08/24 142 lb (64.4 kg)  11/13/23 143 lb (64.9 kg)  09/14/23 143 lb (64.9 kg)       Assessment & Plan:   Problem List Items Addressed This Visit       Unprioritized   Multinodular thyroid    Has one nodule that will need to be followed up on by US  in 1/26.      Hypokalemia   Continues Kdur. Last potassium was normal.      HTN (hypertension)   BP stable on triamterene  hydrochlorothiazide.       Anxiety - Primary   Finding that she is sleeping better on lexapro  5mg . Mood is stable. Family stressors are really a major contributor to her anxiety. However she notes that she manages the stress better on lexapro .       Relevant Medications   escitalopram  (LEXAPRO ) 5 MG tablet    I am having Jeanette Petty start on  escitalopram . I am also having her maintain her Vitamin D3, triamterene -hydrochlorothiazide, and potassium chloride  SA.  Meds ordered this encounter  Medications   escitalopram  (LEXAPRO ) 5 MG tablet    Sig: Take 1 tablet (5 mg total) by mouth daily.    Dispense:  90 tablet    Refill:  1    Supervising Provider:   DOMENICA BLACKBIRD A [[id number]]

## 2024-02-08 NOTE — Assessment & Plan Note (Addendum)
 Finding that she is sleeping better on lexapro  5mg . Mood is stable. Family stressors are really a major contributor to her anxiety. However she notes that she manages the stress better on lexapro .

## 2024-02-08 NOTE — Assessment & Plan Note (Signed)
 Has one nodule that will need to be followed up on by US  in 1/26.

## 2024-02-08 NOTE — Assessment & Plan Note (Signed)
 BP stable on triamterene  hydrochlorothiazide.

## 2024-05-09 ENCOUNTER — Other Ambulatory Visit: Payer: Self-pay | Admitting: Family

## 2024-05-09 DIAGNOSIS — F419 Anxiety disorder, unspecified: Secondary | ICD-10-CM

## 2024-05-16 ENCOUNTER — Telehealth: Payer: Self-pay | Admitting: Family

## 2024-05-16 NOTE — Telephone Encounter (Signed)
 Copied from CRM #[id number]. Topic: Referral - Request for Referral >> May 16, 2024 12:26 PM Drema MATSU wrote: Did the patient discuss referral with their provider in the last year? Yes (If No - schedule appointment) (If Yes - send message)  Appointment offered? No  Type of order/referral and detailed reason for visit: Ultrasound of throat was told to follow up in a year  [facility] of [facility], provider[facility] [place]   If referral order, have you been seen by this specialty before? Yes (If Yes, this issue or another issue? When? Where? [facility] [place] [facility] not sure of name  Can we respond through MyChart? Yes

## 2024-05-19 NOTE — Telephone Encounter (Signed)
 Called patient but no answer, left voice mail for patient to call back.

## 2024-05-20 NOTE — Telephone Encounter (Signed)
Patient has been notified of this information.

## 2024-06-01 ENCOUNTER — Encounter: Payer: Self-pay | Admitting: Family

## 2024-07-29 ENCOUNTER — Encounter: Payer: Self-pay | Admitting: Family

## 2024-07-30 ENCOUNTER — Other Ambulatory Visit: Payer: Self-pay

## 2024-07-30 DIAGNOSIS — F419 Anxiety disorder, unspecified: Secondary | ICD-10-CM

## 2024-07-30 MED ORDER — ESCITALOPRAM OXALATE 5 MG PO TABS: 5.0000 mg | ORAL_TABLET | Freq: Every day | ORAL | 1 refills | Status: AC

## 2024-08-29 ENCOUNTER — Encounter: Admitting: Family

## 2024-11-20 ENCOUNTER — Ambulatory Visit
